# Patient Record
Sex: Female | Born: 1990 | Race: White | Hispanic: No | Smoking: Current every day smoker
Health system: Southern US, Community
[De-identification: ages and names within clinical notes are randomized; demographics above are authoritative.]

## PROBLEM LIST (undated history)

## (undated) ENCOUNTER — Inpatient Hospital Stay (HOSPITAL_COMMUNITY): Payer: Self-pay

## (undated) DIAGNOSIS — R42 Dizziness and giddiness: Secondary | ICD-10-CM

## (undated) DIAGNOSIS — O139 Gestational [pregnancy-induced] hypertension without significant proteinuria, unspecified trimester: Secondary | ICD-10-CM

## (undated) DIAGNOSIS — N926 Irregular menstruation, unspecified: Principal | ICD-10-CM

## (undated) DIAGNOSIS — F329 Major depressive disorder, single episode, unspecified: Secondary | ICD-10-CM

## (undated) DIAGNOSIS — O24419 Gestational diabetes mellitus in pregnancy, unspecified control: Secondary | ICD-10-CM

## (undated) DIAGNOSIS — T7840XA Allergy, unspecified, initial encounter: Secondary | ICD-10-CM

## (undated) DIAGNOSIS — F988 Other specified behavioral and emotional disorders with onset usually occurring in childhood and adolescence: Secondary | ICD-10-CM

## (undated) DIAGNOSIS — D649 Anemia, unspecified: Secondary | ICD-10-CM

## (undated) DIAGNOSIS — R1031 Right lower quadrant pain: Secondary | ICD-10-CM

## (undated) DIAGNOSIS — Z3490 Encounter for supervision of normal pregnancy, unspecified, unspecified trimester: Secondary | ICD-10-CM

## (undated) DIAGNOSIS — F32A Depression, unspecified: Secondary | ICD-10-CM

## (undated) DIAGNOSIS — Z309 Encounter for contraceptive management, unspecified: Secondary | ICD-10-CM

## (undated) HISTORY — DX: Irregular menstruation, unspecified: N92.6

## (undated) HISTORY — DX: Allergy, unspecified, initial encounter: T78.40XA

## (undated) HISTORY — DX: Dizziness and giddiness: R42

## (undated) HISTORY — DX: Other specified behavioral and emotional disorders with onset usually occurring in childhood and adolescence: F98.8

## (undated) HISTORY — DX: Right lower quadrant pain: R10.31

## (undated) HISTORY — DX: Encounter for contraceptive management, unspecified: Z30.9

## (undated) HISTORY — PX: SUPERFICIAL LYMPH NODE BIOPSY / EXCISION: SUR127

## (undated) HISTORY — DX: Gestational diabetes mellitus in pregnancy, unspecified control: O24.419

## (undated) HISTORY — DX: Gestational (pregnancy-induced) hypertension without significant proteinuria, unspecified trimester: O13.9

---
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
## 2010-04-05 ENCOUNTER — Inpatient Hospital Stay (HOSPITAL_COMMUNITY): Admission: AD | Admit: 2010-04-05 | Discharge: 2010-04-05 | Payer: Self-pay | Admitting: Family Medicine

## 2010-04-05 ENCOUNTER — Ambulatory Visit: Payer: Self-pay | Admitting: Family Medicine

## 2010-05-17 ENCOUNTER — Ambulatory Visit (HOSPITAL_COMMUNITY): Admission: RE | Admit: 2010-05-17 | Discharge: 2010-05-17 | Payer: Self-pay | Admitting: Obstetrics & Gynecology

## 2010-05-26 ENCOUNTER — Emergency Department (HOSPITAL_COMMUNITY): Admission: EM | Admit: 2010-05-26 | Discharge: 2010-05-27 | Payer: Self-pay | Admitting: Emergency Medicine

## 2010-07-19 ENCOUNTER — Inpatient Hospital Stay (HOSPITAL_COMMUNITY): Admission: AD | Admit: 2010-07-19 | Discharge: 2010-07-19 | Payer: Self-pay | Admitting: Obstetrics & Gynecology

## 2010-07-19 ENCOUNTER — Ambulatory Visit: Payer: Self-pay | Admitting: Obstetrics & Gynecology

## 2010-07-29 ENCOUNTER — Ambulatory Visit: Payer: Self-pay | Admitting: Obstetrics and Gynecology

## 2010-07-29 ENCOUNTER — Inpatient Hospital Stay (HOSPITAL_COMMUNITY): Admission: AD | Admit: 2010-07-29 | Discharge: 2010-07-29 | Payer: Self-pay | Admitting: Obstetrics and Gynecology

## 2010-08-29 ENCOUNTER — Inpatient Hospital Stay (HOSPITAL_COMMUNITY): Admission: AD | Admit: 2010-08-29 | Discharge: 2010-09-02 | Payer: Self-pay | Admitting: Obstetrics and Gynecology

## 2010-08-29 ENCOUNTER — Ambulatory Visit: Payer: Self-pay | Admitting: Obstetrics and Gynecology

## 2010-08-31 DIAGNOSIS — O1490 Unspecified pre-eclampsia, unspecified trimester: Secondary | ICD-10-CM

## 2010-10-27 ENCOUNTER — Encounter: Payer: Self-pay | Admitting: *Deleted

## 2010-12-18 LAB — PROTEIN / CREATININE RATIO, URINE
Protein Creatinine Ratio: 0.41 — ABNORMAL HIGH (ref 0.00–0.15)
Total Protein, Urine: 12 mg/dL

## 2010-12-18 LAB — COMPREHENSIVE METABOLIC PANEL
ALT: 10 U/L (ref 0–35)
Albumin: 2.8 g/dL — ABNORMAL LOW (ref 3.5–5.2)
Alkaline Phosphatase: 137 U/L — ABNORMAL HIGH (ref 39–117)
BUN: 6 mg/dL (ref 6–23)
CO2: 19 mEq/L (ref 19–32)
Chloride: 108 mEq/L (ref 96–112)
Creatinine, Ser: 0.66 mg/dL (ref 0.4–1.2)
GFR calc non Af Amer: >60 mL/min (ref >60)
Glucose, Bld: 82 mg/dL (ref 70–99)

## 2010-12-18 LAB — CREATININE, SERUM
Creatinine, Ser: 1 mg/dL (ref 0.4–1.2)
GFR calc Af Amer: >60 mL/min (ref >60)
GFR calc non Af Amer: >60 mL/min (ref >60)

## 2010-12-18 LAB — STREP B DNA PROBE

## 2010-12-18 LAB — CBC
HCT: 30.7 % — ABNORMAL LOW (ref 36.0–46.0)
HCT: 31.5 % — ABNORMAL LOW (ref 36.0–46.0)
Hemoglobin: 10.7 g/dL — ABNORMAL LOW (ref 12.0–15.0)
Hemoglobin: 5.4 g/dL — CL (ref 12.0–15.0)
Hemoglobin: 6.6 g/dL — CL (ref 12.0–15.0)
MCH: 29.1 pg (ref 26.0–34.0)
MCH: 29.3 pg (ref 26.0–34.0)
MCV: 86.9 fL (ref 78.0–100.0)
Platelets: 106 10*3/uL — ABNORMAL LOW (ref 150–400)
Platelets: 111 10*3/uL — ABNORMAL LOW (ref 150–400)
Platelets: 117 10*3/uL — ABNORMAL LOW (ref 150–400)
RBC: 1.84 MIL/uL — ABNORMAL LOW (ref 3.87–5.11)
RBC: 2.27 MIL/uL — ABNORMAL LOW (ref 3.87–5.11)
RBC: 3.55 MIL/uL — ABNORMAL LOW (ref 3.87–5.11)
RBC: 3.64 MIL/uL — ABNORMAL LOW (ref 3.87–5.11)
RDW: 12.6 % (ref 11.5–15.5)
WBC: 6.9 10*3/uL (ref 4.0–10.5)
WBC: 8.9 10*3/uL (ref 4.0–10.5)

## 2010-12-18 LAB — URIC ACID: Uric Acid, Serum: 3.6 mg/dL (ref 2.4–7.0)

## 2010-12-18 LAB — RPR: RPR Ser Ql: NONREACTIVE

## 2010-12-18 LAB — DIFFERENTIAL
Basophils Absolute: 0 10*3/uL (ref 0.0–0.1)
Eosinophils Relative: 1 % (ref 0–5)
Lymphs Abs: 1.5 10*3/uL (ref 0.7–4.0)
Monocytes Absolute: 0.6 10*3/uL (ref 0.1–1.0)

## 2010-12-18 LAB — MRSA PCR SCREENING: MRSA by PCR: NEGATIVE

## 2010-12-18 LAB — ABO/RH: ABO/RH(D): O POS

## 2010-12-19 LAB — URINALYSIS, ROUTINE W REFLEX MICROSCOPIC
Bilirubin Urine: NEGATIVE
Glucose, UA: 100 mg/dL — AB
Ketones, ur: NEGATIVE mg/dL
Specific Gravity, Urine: <1.005 — ABNORMAL LOW (ref 1.005–1.030)
Urobilinogen, UA: 0.2 mg/dL (ref 0.0–1.0)

## 2010-12-19 LAB — URINE MICROSCOPIC-ADD ON

## 2010-12-20 LAB — URINALYSIS, ROUTINE W REFLEX MICROSCOPIC
Ketones, ur: NEGATIVE mg/dL
Specific Gravity, Urine: 1.01 (ref 1.005–1.030)
pH: 6 (ref 5.0–8.0)

## 2010-12-20 LAB — GC/CHLAMYDIA PROBE AMP, GENITAL: GC Probe Amp, Genital: NEGATIVE

## 2010-12-20 LAB — URINE MICROSCOPIC-ADD ON

## 2010-12-20 LAB — FETAL FIBRONECTIN: Fetal Fibronectin: NEGATIVE

## 2010-12-23 LAB — URINALYSIS, ROUTINE W REFLEX MICROSCOPIC
Bilirubin Urine: NEGATIVE
Hgb urine dipstick: NEGATIVE
Ketones, ur: 40 mg/dL — AB
Nitrite: NEGATIVE
pH: 7 (ref 5.0–8.0)

## 2011-01-22 LAB — RAPID STREP SCREEN (MED CTR MEBANE ONLY): Streptococcus, Group A Screen (Direct): NEGATIVE

## 2011-02-22 NOTE — Op Note (Signed)
   NAME:  Kathleen Patrick, Kathleen Patrick                      ACCOUNT NO.:  [id number]   MEDICAL RECORD NO.:  [id number]                   PATIENT TYPE:  AMB   LOCATION:  DSC                                  FACILITY:  [facility]   PHYSICIAN:  Prabhakar D. Pendse, M.D.           DATE OF BIRTH:  08-May-1991   DATE OF PROCEDURE:  03/22/2003  DATE OF DISCHARGE:                                 OPERATIVE REPORT   PREOPERATIVE DIAGNOSIS:  Left posterior neck mass, possible lymph node.   POSTOPERATIVE DIAGNOSIS:  Left posterior neck mass.   PROCEDURE:  Excision of left posterior neck mass and layered repair.   SURGEON:  Prabhakar D. Levie Heritage, M.D.   ASSISTANT:  Nurse.   ANESTHESIA:  Nurse.   DESCRIPTION OF PROCEDURE:  Under satisfactory general anesthesia, patient in  the supine position, with her face turned towards the right, the left neck  region was thoroughly prepped and draped in the usual manner.  About a 3 cm  long transverse incision was made directly over the palpable mass.  The skin  and subcutaneous tissue was incised.  Bleeders were individually clamped,  cut and electrocoagulated.  The posterior aspect of the sternocleidomastoid  muscle was overlying the mass which was incised, and the mass was exposed.  It appeared to be either an enlarged lymph node or incorporated fatty  tissue.  The entire mass was removed in total.  Hemostasis was accomplished.  The wound was irrigated.  The muscles were approximated with 4-0 Vicryl  interrupted sutures, subcutaneous tissue with 4-0 Vicryl.  The skin was  closed with 4-0 Monocryl subcuticular sutures.  Steri-Strips applied.  Appropriate dressing applied.  Throughout the procedure, the patient's vital  signs remained stable.  The patient withstood the procedure well and was  transferred to the recovery room in satisfactory general condition.                                               Prabhakar D. Levie Heritage, M.D.    PDP/MEDQ  D:  03/22/2003  T:   03/22/2003  Job:  [id number]   cc:   Colon Flattery, MD  [address]  [place]  [place] [address]  Fax: [phone]

## 2011-08-19 ENCOUNTER — Emergency Department (HOSPITAL_COMMUNITY)
Admission: EM | Admit: 2011-08-19 | Discharge: 2011-08-20 | Disposition: A | Discharge status: Home / Self Care | Payer: Medicaid Other | Attending: Emergency Medicine | Admitting: Emergency Medicine

## 2011-08-19 DIAGNOSIS — N938 Other specified abnormal uterine and vaginal bleeding: Secondary | ICD-10-CM | POA: Insufficient documentation

## 2011-08-19 DIAGNOSIS — N949 Unspecified condition associated with female genital organs and menstrual cycle: Secondary | ICD-10-CM | POA: Insufficient documentation

## 2011-08-19 DIAGNOSIS — F17200 Nicotine dependence, unspecified, uncomplicated: Secondary | ICD-10-CM | POA: Insufficient documentation

## 2011-08-19 LAB — URINALYSIS, ROUTINE W REFLEX MICROSCOPIC
Glucose, UA: NEGATIVE mg/dL
Ketones, ur: NEGATIVE mg/dL
Leukocytes, UA: NEGATIVE
Specific Gravity, Urine: >1.03 — ABNORMAL HIGH (ref 1.005–1.030)
pH: 6 (ref 5.0–8.0)

## 2011-08-19 LAB — URINE MICROSCOPIC-ADD ON

## 2011-08-19 NOTE — ED Notes (Signed)
Vaginal bleeding that started yesterday with pain. Pt states  ' I went through 10 tampons in 2 hours" pt states they were super tampons. Pt not sure if this is her cycle or if she could be pregnant.

## 2011-08-19 NOTE — ED Notes (Signed)
Vaginal bleeding, for 2 days with abd pain.   Alert, color good, Says periods are irregular, and taking bcp's.

## 2011-08-20 LAB — OB RESULTS CONSOLE GC/CHLAMYDIA
Chlamydia: NEGATIVE
Gonorrhea: NEGATIVE

## 2011-08-20 LAB — CBC
HCT: 37.3 % (ref 36.0–46.0)
Hemoglobin: 11.7 g/dL — ABNORMAL LOW (ref 12.0–15.0)
MCHC: 31.4 g/dL (ref 30.0–36.0)
MCV: 82.9 fL (ref 78.0–100.0)
RDW: 16.1 % — ABNORMAL HIGH (ref 11.5–15.5)

## 2011-08-20 MED ORDER — KETOROLAC TROMETHAMINE 60 MG/2ML IM SOLN
60.0000 mg | Freq: Once | INTRAMUSCULAR | Status: AC
  Administered 2011-08-20: 60 mg via INTRAMUSCULAR
  Filled 2011-08-20: qty 2

## 2011-08-20 NOTE — ED Provider Notes (Signed)
Medical screening examination/treatment/procedure(s) were conducted as a shared visit with non-physician practitioner(s) and myself.  I personally evaluated the patient during the encounter.  Complains of excessive vaginal bleeding. Menstrual cycle is irregular.  No complaints of weakness or dizziness. Hemoglobin stable.  Followup with OB/GYN  Donnetta Hutching, MD 08/20/11 [phone]

## 2011-08-20 NOTE — ED Provider Notes (Signed)
History     CSN: [id number] Arrival date & time: 08/19/2011 10:00 PM   First MD Initiated Contact with Patient 08/19/11 2239      Chief Complaint  Patient presents with  . Vaginal Pain    (Consider location/radiation/quality/duration/timing/severity/associated sxs/prior treatment) Patient is a 20 y.o. female presenting with vaginal pain. The history is provided by the patient.  Vaginal Pain This is a new problem. The current episode started yesterday. The problem occurs constantly. The problem has been unchanged. Pertinent negatives include no abdominal pain, arthralgias, chest pain, congestion, fatigue, fever, headaches, joint swelling, nausea, neck pain, numbness, rash, sore throat, urinary symptoms, vomiting or weakness. Associated symptoms comments: Vaginal bleeding . The symptoms are aggravated by nothing. She has tried nothing for the symptoms.    History reviewed. No pertinent past medical history.  Past Surgical History  Procedure Date  . Superficial lymph node biopsy / excision     No family history on file.  History  Substance Use Topics  . Smoking status: Current Everyday Smoker  . Smokeless tobacco: Not on file  . Alcohol Use: No    OB History    Grav Para Term Preterm Abortions TAB SAB Ect Mult Living                  Review of Systems  Constitutional: Negative for fever and fatigue.  HENT: Negative for congestion, sore throat and neck pain.   Eyes: Negative.   Respiratory: Negative for chest tightness and shortness of breath.   Cardiovascular: Negative for chest pain.  Gastrointestinal: Negative for nausea, vomiting and abdominal pain.  Genitourinary: Positive for vaginal bleeding, vaginal pain and pelvic pain. Negative for vaginal discharge.       Pelvic cramping   Musculoskeletal: Negative for joint swelling and arthralgias.  Skin: Negative.  Negative for rash and wound.  Neurological: Negative for dizziness, weakness, light-headedness, numbness  and headaches.  Hematological: Negative.   Psychiatric/Behavioral: Negative.     Allergies  Cefzil  Home Medications   Current Outpatient Rx  Name Route Sig Dispense Refill  . AMPHETAMINE-DEXTROAMPHETAMINE 20 MG PO CP24 Oral Take 20 mg by mouth every morning.      Marland Kitchen ESCITALOPRAM OXALATE 10 MG PO TABS Oral Take 10 mg by mouth daily.      . IBUPROFEN 200 MG PO TABS Oral Take 400 mg by mouth as needed. For pain     . NORGESTIMATE-ETH ESTRADIOL 0.25-35 MG-MCG PO TABS Oral Take 1 tablet by mouth daily.        BP 117/69  Pulse 74  Temp(Src) 97.6 F (36.4 C) (Oral)  Resp 20  Ht 5\' 4"  (1.626 m)  Wt 180 lb (81.647 kg)  BMI 30.90 kg/m2  SpO2 97%  LMP 08/18/2011  Physical Exam  Nursing note and vitals reviewed. Constitutional: She is oriented to person, place, and time. She appears well-developed and well-nourished.  HENT:  Head: Normocephalic and atraumatic.  Eyes: Conjunctivae are normal.  Neck: Normal range of motion.  Cardiovascular: Normal rate, regular rhythm, normal heart sounds and intact distal pulses.   Pulmonary/Chest: Effort normal and breath sounds normal. She has no wheezes.  Abdominal: Soft. Bowel sounds are normal. There is no tenderness. There is no rebound.  Genitourinary: Uterus is tender. Uterus is not enlarged. Cervix exhibits no motion tenderness, no discharge and no friability. Right adnexum displays no mass and no tenderness. Left adnexum displays no mass and no tenderness. No vaginal discharge found.  Os closed.  Slight ttp over uterus,  No cmt.  Musculoskeletal: Normal range of motion.  Neurological: She is alert and oriented to person, place, and time.  Skin: Skin is warm and dry.  Psychiatric: She has a normal mood and affect.    ED Course  Procedures (including critical care time)  Labs Reviewed  URINALYSIS, ROUTINE W REFLEX MICROSCOPIC - Abnormal; Notable for the following:    Specific Gravity, Urine >1.030 (*)    Hgb urine dipstick  MODERATE (*)    All other components within normal limits  CBC - Abnormal; Notable for the following:    Hemoglobin 11.7 (*)    RDW 16.1 (*)    All other components within normal limits  POCT PREGNANCY, URINE  URINE MICROSCOPIC-ADD ON  POCT PREGNANCY, URINE  GC/CHLAMYDIA PROBE AMP, GENITAL  RPR  WET PREP, GENITAL   No results found.   No diagnosis found.    MDM  Dysfunctional uterine bleeding.  Labs stable.  Patient to f/u with Dr Emelda Fear - encouraged to call in the morning.        Candis Musa, PA 08/20/11 [phone]

## 2011-10-08 NOTE — L&D Delivery Note (Signed)
Delivery Note Pt up to br to void, had gush of blood per pt- called out for help, rn assisted back to bed, water broke and infant was delivered by RN as I entered the room. At 9:54 AM a viable female was delivered via Vaginal, Spontaneous Delivery (Presentation:LOA).  APGAR: 9, 9; weight 6 lb 11.2 oz (3040 g).   Placenta status: Intact, Spontaneous.  Cord: 3 vessels with the following complications: None.    Anesthesia: None  Episiotomy: None Lacerations: 1st degree;Perineal Suture Repair: 3.0 vicryl rapide Est. Blood Loss (mL): 400  Mom to postpartum.  Baby to nursery-stable.  Plans to bottlefeed, desires Mirena.  No inpatient circumcision.  Marge Duncans 07/11/2012, 10:31 AM

## 2011-11-05 LAB — OB RESULTS CONSOLE ABO/RH: RH Type: POSITIVE

## 2011-11-05 LAB — OB RESULTS CONSOLE RPR: RPR: NONREACTIVE

## 2011-11-05 LAB — OB RESULTS CONSOLE HEPATITIS B SURFACE ANTIGEN: Hepatitis B Surface Ag: NEGATIVE

## 2011-12-22 ENCOUNTER — Encounter (HOSPITAL_COMMUNITY): Payer: Self-pay | Admitting: Emergency Medicine

## 2011-12-22 ENCOUNTER — Emergency Department (HOSPITAL_COMMUNITY)
Admission: EM | Admit: 2011-12-22 | Discharge: 2011-12-22 | Disposition: A | Discharge status: Home / Self Care | Payer: Medicaid Other | Attending: Emergency Medicine | Admitting: Emergency Medicine

## 2011-12-22 DIAGNOSIS — Z331 Pregnant state, incidental: Secondary | ICD-10-CM

## 2011-12-22 DIAGNOSIS — R5381 Other malaise: Secondary | ICD-10-CM | POA: Insufficient documentation

## 2011-12-22 DIAGNOSIS — O99891 Other specified diseases and conditions complicating pregnancy: Secondary | ICD-10-CM | POA: Insufficient documentation

## 2011-12-22 DIAGNOSIS — R197 Diarrhea, unspecified: Secondary | ICD-10-CM | POA: Insufficient documentation

## 2011-12-22 DIAGNOSIS — R112 Nausea with vomiting, unspecified: Secondary | ICD-10-CM | POA: Insufficient documentation

## 2011-12-22 DIAGNOSIS — R109 Unspecified abdominal pain: Secondary | ICD-10-CM | POA: Insufficient documentation

## 2011-12-22 DIAGNOSIS — A084 Viral intestinal infection, unspecified: Secondary | ICD-10-CM

## 2011-12-22 DIAGNOSIS — R10816 Epigastric abdominal tenderness: Secondary | ICD-10-CM | POA: Insufficient documentation

## 2011-12-22 DIAGNOSIS — A088 Other specified intestinal infections: Secondary | ICD-10-CM | POA: Insufficient documentation

## 2011-12-22 LAB — URINALYSIS, ROUTINE W REFLEX MICROSCOPIC
Glucose, UA: NEGATIVE mg/dL
Hgb urine dipstick: NEGATIVE
Leukocytes, UA: NEGATIVE
Specific Gravity, Urine: >1.03 — ABNORMAL HIGH (ref 1.005–1.030)
Urobilinogen, UA: 0.2 mg/dL (ref 0.0–1.0)

## 2011-12-22 MED ORDER — SODIUM CHLORIDE 0.9 % IV BOLUS (SEPSIS)
1000.0000 mL | Freq: Once | INTRAVENOUS | Status: AC
  Administered 2011-12-22: 1000 mL via INTRAVENOUS

## 2011-12-22 MED ORDER — ONDANSETRON HCL 4 MG PO TABS: 4.0000 mg | ORAL_TABLET | Freq: Four times a day (QID) | ORAL | Status: AC

## 2011-12-22 MED ORDER — PRENATAL COMPLETE 14-0.4 MG PO TABS: 1.0000 | ORAL_TABLET | Freq: Every day | ORAL | Status: DC

## 2011-12-22 MED ORDER — ONDANSETRON HCL 4 MG/2ML IJ SOLN
8.0000 mg | Freq: Once | INTRAMUSCULAR | Status: AC
  Administered 2011-12-22: 8 mg via INTRAVENOUS
  Filled 2011-12-22: qty 4

## 2011-12-22 MED ORDER — MORPHINE SULFATE 2 MG/ML IJ SOLN
2.0000 mg | Freq: Once | INTRAMUSCULAR | Status: AC
  Administered 2011-12-22: 2 mg via INTRAVENOUS
  Filled 2011-12-22: qty 1

## 2011-12-22 NOTE — ED Notes (Addendum)
Pt c/o n/v/d since 0200. Pt is [redacted] weeks pregnant

## 2011-12-22 NOTE — Discharge Instructions (Signed)
Viral Gastroenteritis Viral gastroenteritis is also known as stomach flu. This condition affects the stomach and intestinal tract. It can cause sudden diarrhea and vomiting. The illness typically lasts 3 to 8 days. Most people develop an immune response that eventually gets rid of the virus. While this natural response develops, the virus can make you quite ill. CAUSES  Many different viruses can cause gastroenteritis, such as rotavirus or noroviruses. You can catch one of these viruses by consuming contaminated food or water. You may also catch a virus by sharing utensils or other personal items with an infected person or by touching a contaminated surface. SYMPTOMS  The most common symptoms are diarrhea and vomiting. These problems can cause a severe loss of body fluids (dehydration) and a body salt (electrolyte) imbalance. Other symptoms may include:  Fever.   Headache.   Fatigue.   Abdominal pain.  DIAGNOSIS  Your caregiver can usually diagnose viral gastroenteritis based on your symptoms and a physical exam. A stool sample may also be taken to test for the presence of viruses or other infections. TREATMENT  This illness typically goes away on its own. Treatments are aimed at rehydration. The most serious cases of viral gastroenteritis involve vomiting so severely that you are not able to keep fluids down. In these cases, fluids must be given through an intravenous line (IV). HOME CARE INSTRUCTIONS   Drink enough fluids to keep your urine clear or pale yellow. Drink small amounts of fluids frequently and increase the amounts as tolerated.   Ask your caregiver for specific rehydration instructions.   Avoid:   Foods high in sugar.   Alcohol.   Carbonated drinks.   Tobacco.   Juice.   Caffeine drinks.   Extremely hot or cold fluids.   Fatty, greasy foods.   Too much intake of anything at one time.   Dairy products until 24 to 48 hours after diarrhea stops.   You may  consume probiotics. Probiotics are active cultures of beneficial bacteria. They may lessen the amount and number of diarrheal stools in adults. Probiotics can be found in yogurt with active cultures and in supplements.   Wash your hands well to avoid spreading the virus.   Only take over-the-counter or prescription medicines for pain, discomfort, or fever as directed by your caregiver. Do not give aspirin to children. Antidiarrheal medicines are not recommended.   Ask your caregiver if you should continue to take your regular prescribed and over-the-counter medicines.   Keep all follow-up appointments as directed by your caregiver.  SEEK IMMEDIATE MEDICAL CARE IF:   You are unable to keep fluids down.   You do not urinate at least once every 6 to 8 hours.   You develop shortness of breath.   You notice blood in your stool or vomit. This may look like coffee grounds.   You have abdominal pain that increases or is concentrated in one small area (localized).   You have persistent vomiting or diarrhea.   You have a fever.   The patient is a child younger than 3 months, and he or she has a fever.   The patient is a child older than 3 months, and he or she has a fever and persistent symptoms.   The patient is a child older than 3 months, and he or she has a fever and symptoms suddenly get worse.   The patient is a baby, and he or she has no tears when crying.  MAKE SURE YOU:     Understand these instructions.   Will watch your condition.   Will get help right away if you are not doing well or get worse.  Document Released: 09/23/2005 Document Revised: 09/12/2011 Document Reviewed: 07/10/2011 [employer] Patient Information 2012 [employer]. 

## 2011-12-22 NOTE — ED Provider Notes (Signed)
History     CSN: [id number]  Arrival date & time 12/22/11  1243   First MD Initiated Contact with Patient 12/22/11 1307      Chief Complaint  Patient presents with  . Emesis    (Consider location/radiation/quality/duration/timing/severity/associated sxs/prior treatment) HPI Comments: G2P1 at [redacted] weeks gestation preents with N/V/D since 2am. Diffuse abdominal pain with presence in the epigastrium and lower abdomen. She has no vaginal bleeding or discharge.  Patient is a 21 y.o. female presenting with vomiting. The history is provided by the patient. No language interpreter was used.  Emesis  This is a new problem. The current episode started 12 to 24 hours ago. The problem occurs 5 to 10 times per day. The problem has been gradually worsening. The emesis has an appearance of stomach contents. There has been no fever. Associated symptoms include abdominal pain and diarrhea. Pertinent negatives include no arthralgias, no chills, no cough, no fever, no headaches and no myalgias.    History reviewed. No pertinent past medical history.  Past Surgical History  Procedure Date  . Superficial lymph node biopsy / excision     No family history on file.  History  Substance Use Topics  . Smoking status: Former Games developer  . Smokeless tobacco: Not on file  . Alcohol Use: No    OB History    Grav Para Term Preterm Abortions TAB SAB Ect Mult Living   1               Review of Systems  Constitutional: Positive for fatigue. Negative for fever, chills, activity change and appetite change.  HENT: Negative for congestion, sore throat, rhinorrhea, neck pain and neck stiffness.   Respiratory: Negative for cough and shortness of breath.   Cardiovascular: Negative for chest pain and palpitations.  Gastrointestinal: Positive for nausea, vomiting, abdominal pain and diarrhea. Negative for constipation and blood in stool.  Genitourinary: Negative for dysuria, urgency, frequency, flank pain, vaginal  bleeding, vaginal discharge and pelvic pain.  Musculoskeletal: Negative for myalgias, back pain and arthralgias.  Neurological: Negative for dizziness, weakness, light-headedness, numbness and headaches.  All other systems reviewed and are negative.    Allergies  Cefzil  Home Medications   Current Outpatient Rx  Name Route Sig Dispense Refill  . ONDANSETRON HCL 4 MG PO TABS Oral Take 1 tablet (4 mg total) by mouth every 6 (six) hours. 12 tablet 0  . PRENATAL COMPLETE 14-0.4 MG PO TABS Oral Take 1 tablet by mouth daily. 60 each 0    BP 101/57  Pulse 73  Temp(Src) 98.3 F (36.8 C) (Oral)  Resp 16  Ht 5\' 4"  (1.626 m)  Wt 170 lb (77.111 kg)  BMI 29.18 kg/m2  SpO2 100%  LMP 08/18/2011  Physical Exam  Nursing note and vitals reviewed. Constitutional: She is oriented to person, place, and time. She appears well-developed and well-nourished. No distress.  HENT:  Head: Normocephalic and atraumatic.  Mouth/Throat: Oropharynx is clear and moist.  Eyes: Conjunctivae and EOM are normal. Pupils are equal, round, and reactive to light.  Neck: Normal range of motion. Neck supple.  Cardiovascular: Normal rate, regular rhythm, normal heart sounds and intact distal pulses.  Exam reveals no gallop and no friction rub.   No murmur heard. Pulmonary/Chest: Effort normal and breath sounds normal. No respiratory distress.  Abdominal: Soft. Bowel sounds are normal. There is tenderness (epigastric and lower abdominal tenderness). There is no rebound and no guarding.       Gravid uterus  with fundus palpable below umbilicus  Musculoskeletal: Normal range of motion. She exhibits no edema and no tenderness.  Neurological: She is alert and oriented to person, place, and time. No cranial nerve deficit.  Skin: Skin is warm and dry.    ED Course  [place] bedside Date/Time: 12/22/2011 1:40 PM Performed by: Dayton Bailiff Authorized by: Dayton Bailiff Consent: Verbal consent obtained. Written consent not  obtained. Risks and benefits: risks, benefits and alternatives were discussed Consent given by: patient Patient understanding: patient states understanding of the procedure being performed Patient identity confirmed: verbally with patient Time out: Immediately prior to procedure a "time out" was called to verify the correct patient, procedure, equipment, support staff and site/side marked as required. Patient tolerance: Patient tolerated the procedure well with no immediate complications. Comments: Good fetal movement and fetal heart activity visualized and captured in a clip.  Single IUP. Performed using transabdominal approach using phased array probe.  Performed due to mild abdominal pain to confirm IUP and FHT   (including critical care time)  Labs Reviewed  URINALYSIS, ROUTINE W REFLEX MICROSCOPIC - Abnormal; Notable for the following:    Specific Gravity, Urine >1.030 (*)    Ketones, ur >80 (*)    All other components within normal limits   No results found.   1. Viral gastroenteritis   2. Pregnancy, incidental       MDM  Viral gastroenteritis and pregnancy. Patient was found to be dehydrated secondary ketones in the urine. She received 2 L of IV fluids with improvement of her symptoms. She is able tolerate oral intake in the emergency department. Will be discharged home with a prescription for Zofran. Instructed to followup with her primary care physician. I also provided a prescription for prenatal vitamins. Her to followup with her OB/GYN this week. I have no concern about abnormality secondary to her pregnancy as she had a normal ultrasound. Per should continue aggressive oral hydration at home. Provided strict return precautions        Dayton Bailiff, MD 12/22/11 [phone]

## 2012-04-05 ENCOUNTER — Emergency Department (HOSPITAL_COMMUNITY)
Admission: EM | Admit: 2012-04-05 | Discharge: 2012-04-05 | Disposition: A | Discharge status: Home / Self Care | Payer: Medicaid Other | Attending: Emergency Medicine | Admitting: Emergency Medicine

## 2012-04-05 ENCOUNTER — Encounter (HOSPITAL_COMMUNITY): Payer: Self-pay | Admitting: *Deleted

## 2012-04-05 DIAGNOSIS — Z3490 Encounter for supervision of normal pregnancy, unspecified, unspecified trimester: Secondary | ICD-10-CM

## 2012-04-05 DIAGNOSIS — M549 Dorsalgia, unspecified: Secondary | ICD-10-CM | POA: Insufficient documentation

## 2012-04-05 DIAGNOSIS — N390 Urinary tract infection, site not specified: Secondary | ICD-10-CM | POA: Insufficient documentation

## 2012-04-05 DIAGNOSIS — O2390 Unspecified genitourinary tract infection in pregnancy, unspecified trimester: Secondary | ICD-10-CM | POA: Insufficient documentation

## 2012-04-05 HISTORY — DX: Encounter for supervision of normal pregnancy, unspecified, unspecified trimester: Z34.90

## 2012-04-05 LAB — URINALYSIS, ROUTINE W REFLEX MICROSCOPIC
Glucose, UA: NEGATIVE mg/dL
Ketones, ur: NEGATIVE mg/dL
Nitrite: NEGATIVE
Protein, ur: NEGATIVE mg/dL
pH: 7 (ref 5.0–8.0)

## 2012-04-05 LAB — URINE MICROSCOPIC-ADD ON

## 2012-04-05 MED ORDER — NITROFURANTOIN MACROCRYSTAL 100 MG PO CAPS
100.0000 mg | ORAL_CAPSULE | Freq: Once | ORAL | Status: AC
  Administered 2012-04-05: 100 mg via ORAL
  Filled 2012-04-05: qty 1

## 2012-04-05 MED ORDER — NITROFURANTOIN MACROCRYSTAL 100 MG PO CAPS: 100.0000 mg | ORAL_CAPSULE | Freq: Two times a day (BID) | ORAL | Status: AC

## 2012-04-05 MED ORDER — ABCIXIMAB 2 MG/ML IV SOLN
INTRAVENOUS | Status: DC
Start: 2012-04-05 — End: 2012-04-05
  Filled 2012-04-05: qty 5

## 2012-04-05 MED ORDER — SODIUM CHLORIDE 0.9 % IV BOLUS (SEPSIS): 1000.0000 mL | INTRAVENOUS | Status: DC

## 2012-04-05 NOTE — Discharge Instructions (Signed)
Drink lots of fluids. Take the antibiotic as directed. Followup with your OB GYN this coming week.

## 2012-04-05 NOTE — Progress Notes (Signed)
Vinson Moselle, RN @ [facility] to notify that no FHR had been tracing.  She stated that she had been adjusting monitor and reports increased FM.  Pt to go to bathroom and then attempt to adjust again

## 2012-04-05 NOTE — Progress Notes (Signed)
Pt in to [facility] @ 27.2 wks c/o contractions.  Pt is G2/P1.  No reports of bleeding or leaking of fluid.  Pt reports good fetal movement

## 2012-04-05 NOTE — ED Provider Notes (Signed)
History     CSN: [id number]  Arrival date & time 04/05/12  0009   First MD Initiated Contact with Patient 04/05/12 0029      Chief Complaint  Patient presents with  . Contractions  . Back Pain  . Abdominal Pain    (Consider location/radiation/quality/duration/timing/severity/associated sxs/prior treatment) HPI Kathleen Patrick is a 21 y.o. female G2 P1 [facility] 07/03/2012  who presents to the Emergency Department complaining of lower back pain that radiates around to her abdomen that began an hour ago. Denies fever, chills, nausea, vomiting, diarrhea,dysuria. Increased fetal activity. NO vaginal discharge, leaking, bleeding.  OB/GYN Dr. Emelda Fear  Past Medical History  Diagnosis Date  . Pregnancy     Past Surgical History  Procedure Date  . Superficial lymph node biopsy / excision     History reviewed. No pertinent family history.  History  Substance Use Topics  . Smoking status: Former Games developer  . Smokeless tobacco: Not on file  . Alcohol Use: No    OB History    Grav Para Term Preterm Abortions TAB SAB Ect Mult Living   1               Review of Systems  Constitutional: Negative for fever.       10 Systems reviewed and are negative for acute change except as noted in the HPI.  HENT: Negative for congestion.   Eyes: Negative for discharge and redness.  Respiratory: Negative for cough and shortness of breath.   Cardiovascular: Negative for chest pain.  Gastrointestinal: Positive for abdominal pain. Negative for vomiting.  Musculoskeletal: Positive for back pain.  Skin: Negative for rash.  Neurological: Negative for syncope, numbness and headaches.  Psychiatric/Behavioral:       No behavior change.    Allergies  Cefzil  Home Medications   Current Outpatient Rx  Name Route Sig Dispense Refill  . NITROFURANTOIN MACROCRYSTAL 100 MG PO CAPS Oral Take 1 capsule (100 mg total) by mouth 2 (two) times daily with a meal. 10 capsule 0  . PRENATAL COMPLETE 14-0.4 MG  PO TABS Oral Take 1 tablet by mouth daily. 60 each 0    BP 110/52  Pulse 75  Resp 16  LMP 08/18/2011  Physical Exam  Nursing note and vitals reviewed. Constitutional: She is oriented to person, place, and time. She appears well-developed and well-nourished. No distress.       Awake, alert, nontoxic appearance.  HENT:  Head: Atraumatic.  Eyes: Right eye exhibits no discharge. Left eye exhibits no discharge.  Neck: Neck supple.  Cardiovascular: Normal rate and normal heart sounds.   Pulmonary/Chest: Effort normal. She exhibits no tenderness.  Abdominal: Soft. There is no tenderness. There is no rebound.  Genitourinary:       Gravid uterus. No cva tenderness  Musculoskeletal: She exhibits no tenderness.       Baseline ROM, no obvious new focal weakness.  Neurological: She is alert and oriented to person, place, and time.       Mental status and motor strength appears baseline for patient and situation.  Skin: Skin is warm and dry. No rash noted.  Psychiatric: She has a normal mood and affect.    ED Course  Procedures (including critical care time)  Labs Reviewed  URINALYSIS, ROUTINE W REFLEX MICROSCOPIC - Abnormal; Notable for the following:    Leukocytes, UA MODERATE (*)     All other components within normal limits  URINE MICROSCOPIC-ADD ON - Abnormal; Notable for the following:  Squamous Epithelial / LPF MANY (*)     Bacteria, UA FEW (*)     All other components within normal limits  LAB REPORT - SCANNED   No results found.   1. Pregnancy   2. Urinary tract infection    Patient placed on toco monitor. One contraction witnessed in stay here.    MDM  Pregnant patient here with back pain that radiated to lower abdomen. Given IVF. UA positive for infection. Initiated antibiotic therapy.Patient took PO fluids. One contraction on toco otherwise mild iritability. FHT 135-149.Pt stable in ED with no significant deterioration in condition.The patient appears reasonably  screened and/or stabilized for discharge and I doubt any other medical condition or other [facility] requiring further screening, evaluation, or treatment in the ED at this time prior to discharge.  MDM Reviewed: nursing note and vitals Interpretation: labs            Nicoletta Dress. Colon Branch, MD 04/05/12 2140

## 2012-04-05 NOTE — Progress Notes (Signed)
Spoke w/ Steward Drone, RN to update on tracing.  She stated that she would update me once pt has had cervix checked

## 2012-04-05 NOTE — ED Notes (Signed)
Pt c/o lower back pain radiating around to back x 1hr. EDD: 07-03-12

## 2012-04-05 NOTE — Progress Notes (Signed)
Steward Drone, RN called to update that pt had been examined and is being treated for UTI and d/c home

## 2012-06-08 ENCOUNTER — Encounter (HOSPITAL_COMMUNITY): Payer: Self-pay | Admitting: *Deleted

## 2012-06-08 ENCOUNTER — Inpatient Hospital Stay (HOSPITAL_COMMUNITY)
Admission: AD | Admit: 2012-06-08 | Discharge: 2012-06-08 | Disposition: A | Discharge status: Home / Self Care | Payer: Medicaid Other | Source: Ambulatory Visit | Attending: Obstetrics & Gynecology | Admitting: Obstetrics & Gynecology

## 2012-06-08 DIAGNOSIS — O479 False labor, unspecified: Secondary | ICD-10-CM

## 2012-06-08 DIAGNOSIS — O4700 False labor before 37 completed weeks of gestation, unspecified trimester: Secondary | ICD-10-CM | POA: Insufficient documentation

## 2012-06-08 DIAGNOSIS — O368190 Decreased fetal movements, unspecified trimester, not applicable or unspecified: Secondary | ICD-10-CM | POA: Insufficient documentation

## 2012-06-08 HISTORY — DX: Anemia, unspecified: D64.9

## 2012-06-08 NOTE — MAU Note (Signed)
Pt states she has been having a different type of vaginal pressure than she has been experiencing since last night around 2200.  Pt was advised to come to MAU after speaking with nurse at [facility] and reported decreased kick counts of 2 in 2 hours.  No vaginal bleeding.

## 2012-06-08 NOTE — ED Provider Notes (Signed)
First Provider Initiated Contact with Patient 06/08/12 2002      Chief Complaint:  Labor Eval   Kathleen Patrick is  21 y.o. G2P1001 at [redacted]w[redacted]d presents complaining of Labor Eval .  She states none contractions are associated with none vaginal bleeding, intact membranes, along with decreased  fetal movement. Feels pressure  Obstetrical/Gynecological History: Menstrual History: OB History    Grav Para Term Preterm Abortions TAB SAB Ect Mult Living   2 1 1       1        Patient's last menstrual period was 08/18/2011.     Past Medical History: Past Medical History  Diagnosis Date  . Pregnancy   . Anemia     Past Surgical History: Past Surgical History  Procedure Date  . Superficial lymph node biopsy / excision     Family History: Family History  Problem Relation Age of Onset  . Other Neg Hx     Social History: History  Substance Use Topics  . Smoking status: Former Smoker -- 1.0 packs/day    Quit date: 04/07/2012  . Smokeless tobacco: Never Used  . Alcohol Use: No    Allergies:  Allergies  Allergen Reactions  . Cefzil (Cefprozil) Hives    Meds:  Prescriptions prior to admission  Medication Sig Dispense Refill  . Prenatal Vit-Fe Fumarate-FA (PRENATAL COMPLETE) 14-0.4 MG TABS Take 1 tablet by mouth daily.  60 each  0    Review of Systems - Please refer to the aforementioned patients' reports.     Physical Exam  Blood pressure 110/66, pulse 101, temperature 98.6 F (37 C), temperature source Oral, resp. rate 18, height 5\' 4"  (1.626 m), weight 79.379 kg (175 lb), last menstrual period 08/18/2011, SpO2 99.00%. GENERAL: Well-developed, well-nourished female in no acute distress.  LUNGS: Clear to auscultation bilaterally.  HEART: Regular rate and rhythm. ABDOMEN: Soft, nontender, nondistended, gravid.  EXTREMITIES: Nontender, no edema, 2+ distal pulses. CERVICAL EXAM: Dilatation 1.5cm   Effacement 0%   Station -3   Presentation: cephalic FHT:   Baseline rate 161  bpm   Variability moderate  Accelerations present   Decelerations none Contractions: Every 0 mins   Labs: No results found for this or any previous visit (from the past 24 hour(s)). Imaging Studies:  No results found.  Assessment: Kathleen Patrick is  20 y.o. G2P1001 at [redacted]w[redacted]d presents with reactive NST, pressure,  No labor.  Plan: Dc home. F/U Thursday as scheduled  CRESENZO-DISHMAN,Rossana Molchan 9/2/20138:13 PM

## 2012-06-29 ENCOUNTER — Encounter (HOSPITAL_COMMUNITY): Payer: Self-pay | Admitting: *Deleted

## 2012-06-29 ENCOUNTER — Inpatient Hospital Stay (HOSPITAL_COMMUNITY)
Admission: AD | Admit: 2012-06-29 | Discharge: 2012-06-29 | Disposition: A | Discharge status: Home / Self Care | Payer: Medicaid Other | Source: Ambulatory Visit | Attending: Obstetrics & Gynecology | Admitting: Obstetrics & Gynecology

## 2012-06-29 DIAGNOSIS — O99891 Other specified diseases and conditions complicating pregnancy: Secondary | ICD-10-CM | POA: Insufficient documentation

## 2012-06-29 DIAGNOSIS — Z3480 Encounter for supervision of other normal pregnancy, unspecified trimester: Secondary | ICD-10-CM

## 2012-06-29 NOTE — MAU Note (Signed)
1900 pt reports clear fluid from vagina requiring to change panty liner.  No vaginal bleeding.  Pt reports lots of vaginal pressure.  Pt states baby had been moving but decreased more today than normal.

## 2012-06-29 NOTE — MAU Provider Note (Signed)
  History     CSN: [id number]  Arrival date and time: 06/29/12 2041   First Provider Initiated Contact with Patient 06/29/12 2218         Chief Complaint  Patient presents with  . Rupture of Membranes  . Labor Eval   HPI Comments: 21yo G2P1001 @ 39.3 seen at [facility] p/w possible SROM around 1900 tonight. Pt reports some clear leakage x3 different occasions over the past few hours.  Put on a pad and noticed clear, non-urine smelling watery d/c. No vaginal bleeding. + FM. Occasional contractions, but not regular.  Frustrated that she has been a 1.5-2cm cervix for multiple weeks.  Is ready to be in labor. + pressure.   OB History    Grav Para Term Preterm Abortions TAB SAB Ect Mult Living   2 1 1       1       Past Medical History  Diagnosis Date  . Pregnancy   . Anemia     Past Surgical History  Procedure Date  . Superficial lymph node biopsy / excision     Family History  Problem Relation Age of Onset  . Other Neg Hx     History  Substance Use Topics  . Smoking status: Former Smoker -- 1.0 packs/day    Quit date: 04/07/2012  . Smokeless tobacco: Never Used  . Alcohol Use: No    Allergies:  Allergies  Allergen Reactions  . Cefzil (Cefprozil) Hives    Prescriptions prior to admission  Medication Sig Dispense Refill  . Prenatal Vit-Fe Fumarate-FA (PRENATAL COMPLETE) 14-0.4 MG TABS Take 1 tablet by mouth daily.  60 each  0  . PRESCRIPTION MEDICATION Take 1 tablet by mouth daily as needed. For migraines      . PRESCRIPTION MEDICATION Take 2 tablets by mouth once. antibiotic        Review of Systems  Constitutional: Negative for fever.  Gastrointestinal: Positive for abdominal pain.  Genitourinary: Negative for dysuria.  Neurological: Negative for headaches.   Physical Exam   Blood pressure 117/69, pulse 83, temperature 99.3 F (37.4 C), temperature source Oral, resp. rate 18, height 5\' 4"  (1.626 m), weight 82.101 kg (181 lb), last menstrual period 08/18/2011,  SpO2 100.00%.  Physical Exam  Constitutional: She appears well-developed and well-nourished. No distress.  Cardiovascular: Normal rate and regular rhythm.   No murmur heard. Respiratory: Effort normal and breath sounds normal. No respiratory distress.  GI:       gravid  Musculoskeletal: She exhibits no edema.  Neurological: She is alert.  Skin: Skin is warm and dry.   Dilation: 2 Effacement (%): 50 Cervical Position: Middle Station: -2 Presentation: Vertex Exam by:: L. Paschal, RN  FHT: 120's, + accels, no decels, mod variability Toco: irritability with occasional ctx  MAU Course  Procedures  MDM Pt w/ c/o possible SROM. No pooling on sterile spec exam. Amnisure sent. No ctx on monitor. Will d/c pt home if amnisure negative  Assessment and Plan  21 yo G2P1001 @ 39.3 p/w concern for labor/SROM, amnisure negative -D/c home with labor and bleeding precautions -F[facility] at [facility] as scheduled.  Kathleen Patrick 06/29/2012, 10:14 PM

## 2012-06-30 NOTE — MAU Provider Note (Signed)
I saw and examined patient and read NST and agree with above.  FHTs baseline 120, moderate variability, accels present, no decels - reactive NST. Napoleon Form, MD

## 2012-07-07 LAB — OB RESULTS CONSOLE GC/CHLAMYDIA: Gonorrhea: NEGATIVE

## 2012-07-10 ENCOUNTER — Encounter (HOSPITAL_COMMUNITY): Payer: Self-pay

## 2012-07-10 ENCOUNTER — Inpatient Hospital Stay (HOSPITAL_COMMUNITY)
Admission: RE | Admit: 2012-07-10 | Discharge: 2012-07-12 | DRG: 775 | Disposition: A | Discharge status: Home / Self Care | Payer: Medicaid Other | Source: Ambulatory Visit | Attending: Obstetrics & Gynecology | Admitting: Obstetrics & Gynecology

## 2012-07-10 DIAGNOSIS — O480 Post-term pregnancy: Principal | ICD-10-CM | POA: Diagnosis present

## 2012-07-10 LAB — CBC
HCT: 34.5 % — ABNORMAL LOW (ref 36.0–46.0)
Hemoglobin: 10.9 g/dL — ABNORMAL LOW (ref 12.0–15.0)
RBC: 4.26 MIL/uL (ref 3.87–5.11)
RDW: 14.1 % (ref 11.5–15.5)
WBC: 8.3 10*3/uL (ref 4.0–10.5)

## 2012-07-10 MED ORDER — LIDOCAINE HCL (PF) 1 % IJ SOLN
30.0000 mL | INTRAMUSCULAR | Status: DC | PRN
  Administered 2012-07-11: 30 mL via SUBCUTANEOUS
  Filled 2012-07-10: qty 30

## 2012-07-10 MED ORDER — IBUPROFEN 600 MG PO TABS
600.0000 mg | ORAL_TABLET | Freq: Four times a day (QID) | ORAL | Status: DC | PRN
  Administered 2012-07-11: 600 mg via ORAL
  Filled 2012-07-10: qty 1

## 2012-07-10 MED ORDER — MISOPROSTOL 25 MCG QUARTER TABLET
25.0000 ug | ORAL_TABLET | ORAL | Status: DC | PRN
  Administered 2012-07-10 (×2): 25 ug via VAGINAL
  Filled 2012-07-10 (×2): qty 0.25
  Filled 2012-07-10: qty 1

## 2012-07-10 MED ORDER — ONDANSETRON HCL 4 MG/2ML IJ SOLN: 4.0000 mg | Freq: Four times a day (QID) | INTRAMUSCULAR | Status: DC | PRN

## 2012-07-10 MED ORDER — CITRIC ACID-SODIUM CITRATE 334-500 MG/5ML PO SOLN: 30.0000 mL | ORAL | Status: DC | PRN

## 2012-07-10 MED ORDER — LACTATED RINGERS IV SOLN
INTRAVENOUS | Status: DC
  Administered 2012-07-10 – 2012-07-11 (×4): via INTRAVENOUS

## 2012-07-10 MED ORDER — TERBUTALINE SULFATE 1 MG/ML IJ SOLN: 0.2500 mg | Freq: Once | INTRAMUSCULAR | Status: AC | PRN

## 2012-07-10 MED ORDER — OXYTOCIN BOLUS FROM INFUSION
500.0000 mL | Freq: Once | INTRAVENOUS | Status: AC
  Administered 2012-07-11: 500 mL via INTRAVENOUS
  Filled 2012-07-10: qty 500

## 2012-07-10 MED ORDER — FENTANYL CITRATE 0.05 MG/ML IJ SOLN
100.0000 ug | INTRAMUSCULAR | Status: DC | PRN
  Administered 2012-07-11 (×2): 100 ug via INTRAVENOUS
  Filled 2012-07-10 (×3): qty 2

## 2012-07-10 MED ORDER — OXYCODONE-ACETAMINOPHEN 5-325 MG PO TABS: 1.0000 | ORAL_TABLET | ORAL | Status: DC | PRN

## 2012-07-10 MED ORDER — NALBUPHINE SYRINGE 5 MG/0.5 ML
5.0000 mg | INJECTION | INTRAMUSCULAR | Status: DC | PRN
  Administered 2012-07-10: 5 mg via INTRAVENOUS
  Filled 2012-07-10: qty 0.5

## 2012-07-10 MED ORDER — ZOLPIDEM TARTRATE 5 MG PO TABS
5.0000 mg | ORAL_TABLET | Freq: Every evening | ORAL | Status: DC | PRN
  Administered 2012-07-10: 5 mg via ORAL
  Filled 2012-07-10: qty 1

## 2012-07-10 MED ORDER — OXYTOCIN 40 UNITS IN LACTATED RINGERS INFUSION - SIMPLE MED: 62.5000 mL/h | Freq: Once | INTRAVENOUS | Status: DC

## 2012-07-10 MED ORDER — LACTATED RINGERS IV SOLN: 500.0000 mL | INTRAVENOUS | Status: DC | PRN

## 2012-07-10 MED ORDER — ACETAMINOPHEN 325 MG PO TABS: 650.0000 mg | ORAL_TABLET | ORAL | Status: DC | PRN

## 2012-07-10 NOTE — H&P (Signed)
Attestation of Attending Supervision of Advanced Practitioner (CNM/NP): Evaluation and management procedures were performed by the Advanced Practitioner under my supervision and collaboration.  I have reviewed the Advanced Practitioner's note and chart, and I agree with the management and plan.  HARRAWAY-SMITH, Madilynn Montante 4:50 PM     

## 2012-07-10 NOTE — Progress Notes (Signed)
Kathleen Patrick is a 21 y.o. G2P1001 at [redacted]w[redacted]d  admitted for induction of labor due to Post dates..  Subjective: Rested x 1 hr after cyotec and woke up feeling crampy.   Objective: BP 126/75  Pulse 88  Temp 98.4 F (36.9 C) (Oral)  Resp 18  Ht 5\' 4"  (1.626 m)  Wt 82.555 kg (182 lb)  BMI 31.24 kg/m2  LMP 08/18/2011      FHT:  FHR: 120 bpm, variability: moderate,  accelerations:  Present,  decelerations:  Absent UC:   Occasional mild SVE:   Dilation: 2 Effacement (%): 50 Station: Ballotable Exam by:: Jailen Coward, cnm Cx still long, posterior  Tight 2/-3 vtx -> cytotec placed  Labs: Lab Results  Component Value Date   WBC 8.3 07/10/2012   HGB 10.9* 07/10/2012   HCT 34.5* 07/10/2012   MCV 81.0 07/10/2012   PLT 101* 07/10/2012    Assessment / Plan: Multip at 41 wks undergoing cx ripening  Labor: not in labor Preeclampsia:  n/a Fetal Wellbeing:  Category I Pain Control:  n/a I/D:  n/a Anticipated MOD:  NSVD  Rashed Edler 07/10/2012, 3:54 PM

## 2012-07-10 NOTE — H&P (Signed)
Kathleen Patrick is a 21 y.o. female presenting for IOL. Maternal Medical History:  Reason for admission: 21 yo G3P1011 at 41.0 wks here for IOL indicated by late term gestation. Well-dated by 5.4 wk CRL c/w 19 wk scan (irreg cycles). Last [place] 2 wks ago all nl per pt. Uncomplicated PN course at [facility].  Contractions: Onset was 1 week ago.   Frequency: rare.   Perceived severity is mild.    Fetal activity: Perceived fetal activity is normal.   Last perceived fetal movement was within the past hour.    Prenatal complications: no prenatal complications Prenatal Complications - Diabetes: none.    OB History    Grav Para Term Preterm Abortions TAB SAB Ect Mult Living   2 1 1       1      Past Medical History  Diagnosis Date  . Pregnancy   . Anemia    Past Surgical History  Procedure Date  . Superficial lymph node biopsy / excision    Family History: family history includes Cancer in her maternal grandmother and Hypertension in her father.  There is no history of Other. Social History:  reports that she quit smoking about 3 months ago. She has never used smokeless tobacco. She reports that she does not drink alcohol or use illicit drugs.   Prenatal Transfer Tool  Maternal Diabetes: No Genetic Screening: Declined Maternal Ultrasounds/Referrals: Normal Fetal Ultrasounds or other Referrals:  None Maternal Substance Abuse:  No Significant Maternal Medications:  None Significant Maternal Lab Results:  None Other Comments GBS neg  Review of Systems  Constitutional: Negative for fever and chills.  Respiratory: Negative for cough.   Cardiovascular: Negative for leg swelling.  Gastrointestinal: Negative for vomiting.  Genitourinary: Negative for dysuria.  Skin: Negative for rash.  Neurological: Negative for headaches.    Exam by:: Trason Shifflet, cnm Blood pressure 105/75, pulse 76, temperature 98 F (36.7 C), temperature source Oral, resp. rate 20, height 5\' 4"  (1.626 m), weight 82.555 kg  (182 lb), last menstrual period 08/18/2011. Maternal Exam:  Uterine Assessment: Contraction strength is mild.  Contraction duration is 20 seconds. Contraction frequency is rare.   Abdomen: Fundal height is term size.   Estimated fetal weight is 7.5.   Fetal presentation: vertex  Introitus: Normal vulva. Normal vagina.  Ferning test: not done.  Nitrazine test: not done. Amniotic fluid character: not assessed.  Pelvis: adequate for delivery.   Cervix: Cervix evaluated by digital exam.   Posterior, soft, ext 2, int ftp to closed/long/ballotable head  Fetal Exam Fetal Monitor Review: Mode: ultrasound.   Baseline rate: 130.  Variability: moderate (6-25 bpm).   Pattern: accelerations present and no decelerations.    Fetal State Assessment: Category I - tracings are normal.     Physical Exam  Constitutional: She is oriented to person, place, and time. She appears well-developed and well-nourished. No distress.  HENT:  Head: Normocephalic.  Eyes: Pupils are equal, round, and reactive to light.  Neck: Neck supple.  Cardiovascular: Normal rate, regular rhythm and normal heart sounds.   Respiratory: Effort normal and breath sounds normal.  GI: Soft. She exhibits no distension. There is no tenderness.  Genitourinary: Vagina normal and uterus normal.  Musculoskeletal: Normal range of motion.  Neurological: She is alert and oriented to person, place, and time. She has normal reflexes.  Skin: Skin is warm and dry.  Psychiatric: She has a normal mood and affect. Her behavior is normal. Judgment and thought content normal.  Prenatal labs: ABO, Rh: O/Positive/-- (01/29 0000) Antibody: Negative (01/29 0000) Rubella: Immune (01/29 0000) RPR: Nonreactive (01/29 0000)  HBsAg: Negative (01/29 0000)  HIV: Non-reactive (01/29 0000)  GBS: Negative (09/05 0000)  2 hr OGTT 78-11-100  Assessment/Plan: 21 yo G3P1011 at 41 wk, unfavorable cx, for IOL Category 1 FHR ->Plan cytotec cervical  ripening   Kathleen Patrick 07/10/2012, 10:08 AM

## 2012-07-10 NOTE — Progress Notes (Signed)
Patient ID: Kathleen Patrick, female   DOB: May 21, 1991, 21 y.o.   MRN: [id number] S:  Pt feeling cramping and contractions but able to breathe through them.  O:   Filed Vitals:   07/10/12 1642  BP:   Pulse:   Temp: 98.5 F (36.9 C)  Resp: 18   Dilation: 2 Effacement (%): 50 Cervical Position: Middle Station: Ballotable Presentation: Vertex Exam by:: Dr. Thad Ranger  FHT:  120, moderate variability, accels present, no decels TOCO:  q 2-3 minutes  A/P Induction of labor for post-dates. Foleybulb placed without problem. Nubain or fentanyl for pain  Napoleon Form, MD

## 2012-07-11 ENCOUNTER — Encounter (HOSPITAL_COMMUNITY): Payer: Self-pay

## 2012-07-11 DIAGNOSIS — O480 Post-term pregnancy: Secondary | ICD-10-CM

## 2012-07-11 MED ORDER — SIMETHICONE 80 MG PO CHEW: 80.0000 mg | CHEWABLE_TABLET | ORAL | Status: DC | PRN

## 2012-07-11 MED ORDER — TERBUTALINE SULFATE 1 MG/ML IJ SOLN: 0.2500 mg | Freq: Once | INTRAMUSCULAR | Status: DC | PRN

## 2012-07-11 MED ORDER — NALBUPHINE SYRINGE 5 MG/0.5 ML
10.0000 mg | INJECTION | INTRAMUSCULAR | Status: DC | PRN
  Administered 2012-07-11: 10 mg via INTRAVENOUS
  Filled 2012-07-11 (×2): qty 1

## 2012-07-11 MED ORDER — OXYTOCIN 40 UNITS IN LACTATED RINGERS INFUSION - SIMPLE MED
1.0000 m[IU]/min | INTRAVENOUS | Status: DC
  Administered 2012-07-11: 2 m[IU]/min via INTRAVENOUS
  Filled 2012-07-11: qty 1000

## 2012-07-11 MED ORDER — OXYTOCIN 40 UNITS IN LACTATED RINGERS INFUSION - SIMPLE MED: 62.5000 mL/h | INTRAVENOUS | Status: DC | PRN

## 2012-07-11 MED ORDER — SODIUM CHLORIDE 0.9 % IJ SOLN: 3.0000 mL | Freq: Two times a day (BID) | INTRAMUSCULAR | Status: DC

## 2012-07-11 MED ORDER — BISACODYL 10 MG RE SUPP: 10.0000 mg | Freq: Every day | RECTAL | Status: DC | PRN

## 2012-07-11 MED ORDER — ONDANSETRON HCL 4 MG PO TABS: 4.0000 mg | ORAL_TABLET | ORAL | Status: DC | PRN

## 2012-07-11 MED ORDER — LANOLIN HYDROUS EX OINT: TOPICAL_OINTMENT | CUTANEOUS | Status: DC | PRN

## 2012-07-11 MED ORDER — SODIUM CHLORIDE 0.9 % IJ SOLN: 3.0000 mL | INTRAMUSCULAR | Status: DC | PRN

## 2012-07-11 MED ORDER — WITCH HAZEL-GLYCERIN EX PADS: 1.0000 "application " | MEDICATED_PAD | CUTANEOUS | Status: DC | PRN

## 2012-07-11 MED ORDER — SODIUM CHLORIDE 0.9 % IV SOLN: 250.0000 mL | INTRAVENOUS | Status: DC | PRN

## 2012-07-11 MED ORDER — PRENATAL MULTIVITAMIN CH
1.0000 | ORAL_TABLET | Freq: Every day | ORAL | Status: DC
  Administered 2012-07-12: 1 via ORAL
  Filled 2012-07-11: qty 1

## 2012-07-11 MED ORDER — MISOPROSTOL 200 MCG PO TABS
ORAL_TABLET | ORAL | Status: AC
  Administered 2012-07-11: 200 ug via RECTAL
  Filled 2012-07-11: qty 4

## 2012-07-11 MED ORDER — TETANUS-DIPHTH-ACELL PERTUSSIS 5-2.5-18.5 LF-MCG/0.5 IM SUSP
0.5000 mL | Freq: Once | INTRAMUSCULAR | Status: AC
  Administered 2012-07-12: 0.5 mL via INTRAMUSCULAR
  Filled 2012-07-11: qty 0.5

## 2012-07-11 MED ORDER — IBUPROFEN 600 MG PO TABS
600.0000 mg | ORAL_TABLET | Freq: Four times a day (QID) | ORAL | Status: DC
  Administered 2012-07-11 – 2012-07-12 (×4): 600 mg via ORAL
  Filled 2012-07-11 (×5): qty 1

## 2012-07-11 MED ORDER — DIBUCAINE 1 % RE OINT: 1.0000 "application " | TOPICAL_OINTMENT | RECTAL | Status: DC | PRN

## 2012-07-11 MED ORDER — BENZOCAINE-MENTHOL 20-0.5 % EX AERO
1.0000 "application " | INHALATION_SPRAY | CUTANEOUS | Status: DC | PRN
  Administered 2012-07-11: 1 via TOPICAL
  Filled 2012-07-11: qty 56

## 2012-07-11 MED ORDER — SENNOSIDES-DOCUSATE SODIUM 8.6-50 MG PO TABS
2.0000 | ORAL_TABLET | Freq: Every day | ORAL | Status: DC
  Administered 2012-07-11: 2 via ORAL

## 2012-07-11 MED ORDER — ONDANSETRON HCL 4 MG/2ML IJ SOLN: 4.0000 mg | INTRAMUSCULAR | Status: DC | PRN

## 2012-07-11 MED ORDER — FLEET ENEMA 7-19 GM/118ML RE ENEM: 1.0000 | ENEMA | Freq: Every day | RECTAL | Status: DC | PRN

## 2012-07-11 MED ORDER — OXYCODONE-ACETAMINOPHEN 5-325 MG PO TABS
1.0000 | ORAL_TABLET | ORAL | Status: DC | PRN
  Administered 2012-07-11: 1 via ORAL
  Filled 2012-07-11: qty 1

## 2012-07-11 MED ORDER — DIPHENHYDRAMINE HCL 25 MG PO CAPS: 25.0000 mg | ORAL_CAPSULE | Freq: Four times a day (QID) | ORAL | Status: DC | PRN

## 2012-07-11 MED ORDER — METHYLERGONOVINE MALEATE 0.2 MG PO TABS
0.2000 mg | ORAL_TABLET | ORAL | Status: DC | PRN
  Administered 2012-07-11: 0.2 mg via ORAL
  Filled 2012-07-11: qty 1

## 2012-07-11 MED ORDER — ZOLPIDEM TARTRATE 5 MG PO TABS: 5.0000 mg | ORAL_TABLET | Freq: Every evening | ORAL | Status: DC | PRN

## 2012-07-11 MED ORDER — MEASLES, MUMPS & RUBELLA VAC ~~LOC~~ INJ
0.5000 mL | INJECTION | Freq: Once | SUBCUTANEOUS | Status: DC
  Filled 2012-07-11: qty 0.5

## 2012-07-11 NOTE — Progress Notes (Signed)
Notified [name] CNM of massaging fundus frequently to get firm, deep and noting trickle occasionally. Fundus tender. New order received.

## 2012-07-11 NOTE — Progress Notes (Signed)
Called to evaluate boggy fundus by RN.   Pt reports need to void. Assisted to br, voided , new underwear, pad & ice pack applied.  Back to bed, fundus slightly boggy @ u-2, but firms w/ massage.  Scant lochia noted w/ massage.  Pt states this happened after birth of 1st child as well.  RN to proceed w/ previously ordered methergine po q4hr prn order.   Cheral Marker, CNM, WHNP-BC

## 2012-07-11 NOTE — Progress Notes (Signed)
Called by RN, pt had large gush of lochia w/ fundal massage, and has slow but steady trickle.  VSS. Fundus firm u/1. cytotec pr. Cheral Marker, CNM, WHNP-BC

## 2012-07-11 NOTE — Progress Notes (Signed)
Kathleen Patrick is a 21 y.o. G2P1001 at [redacted]w[redacted]d admitted for induction of labor due to Post dates. Due date 07/03/12.  Subjective: Just recently received iv pain meds, helping w/ pain slighlty.  Declines arom at this time. Family supportive at bs.  Objective: BP 120/83  Pulse 65  Temp 97.9 F (36.6 C) (Oral)  Resp 20  Ht 5\' 4"  (1.626 m)  Wt 82.555 kg (182 lb)  BMI 31.24 kg/m2  LMP 08/18/2011      FHT:  FHR: 110 bpm, variability: moderate,  accelerations:  Present,  decelerations:  Absent UC:   regular, every 2-4 minutes, not tracing well on efm d/t maternal position- toco readjusted SVE:   6/90-/2 @ 0825 by patti, rn  Labs: Lab Results  Component Value Date   WBC 8.3 07/10/2012   HGB 10.9* 07/10/2012   HCT 34.5* 07/10/2012   MCV 81.0 07/10/2012   PLT 101* 07/10/2012    Assessment / Plan: Induction of labor due to postdates,  progressing well on pitocin  Labor: progressing on pitocin, declines arom, will continue to increase pitocin as needed Preeclampsia:  n/a Fetal Wellbeing:  Category I Pain Control:  iv meds I/D:  n/a Anticipated MOD:  NSVD  Marge Duncans 07/11/2012, 10:38 AM

## 2012-07-11 NOTE — Progress Notes (Signed)
SVD of viable female.  Genella Rife CNM at bedside for delivery.

## 2012-07-12 LAB — CBC
HCT: 28.5 % — ABNORMAL LOW (ref 36.0–46.0)
MCHC: 31.6 g/dL (ref 30.0–36.0)
MCV: 81.2 fL (ref 78.0–100.0)
Platelets: 80 10*3/uL — ABNORMAL LOW (ref 150–400)
RDW: 14.4 % (ref 11.5–15.5)
WBC: 9.2 10*3/uL (ref 4.0–10.5)

## 2012-07-12 MED ORDER — IBUPROFEN 600 MG PO TABS: 600.0000 mg | ORAL_TABLET | Freq: Four times a day (QID) | ORAL | Status: DC

## 2012-07-12 NOTE — Discharge Summary (Signed)
Obstetric Discharge Summary Reason for Admission: induction of labor Prenatal Procedures: NST and ultrasound Intrapartum Procedures: spontaneous vaginal delivery Postpartum Procedures: none Complications-Operative and Postpartum: 1st degree perineal laceration Hemoglobin  Date Value Range Status  07/12/2012 9.0* 12.0 - 15.0 g/dL Final     DELTA CHECK NOTED     REPEATED TO VERIFY     HCT  Date Value Range Status  07/12/2012 28.5* 36.0 - 46.0 % Final    Physical Exam:  General: alert, cooperative and appears stated age 21: appropriate Uterine Fundus: firm Incision: n/a DVT Evaluation: No evidence of DVT seen on physical exam.  Discharge Diagnoses: Post-date pregnancy  Discharge Information: Date: 07/12/2012 Activity: pelvic rest Diet: routine Medications: Ibuprofen Condition: stable Instructions: refer to practice specific booklet Discharge to: home Follow-up Information    Follow up with [facility]. In 6 weeks.         Newborn Data: Live born female  Birth Weight: 6 lb 11.2 oz (3039 g) APGAR: 9, 9  Home with mother.  [facility] 07/12/2012, 7:35 PM

## 2012-07-12 NOTE — Progress Notes (Signed)
Post Partum Day 1 Subjective: no complaints, up ad lib, voiding and tolerating PO  Objective: Blood pressure 102/65, pulse 58, temperature 97.6 F (36.4 C), temperature source Oral, resp. rate 18, height 5\' 4"  (1.626 m), weight 82.555 kg (182 lb), last menstrual period 08/18/2011, SpO2 98.00%, unknown if currently breastfeeding.  Physical Exam:  General: alert, cooperative and no distress Lochia: appropriate Uterine Fundus: firm Incision: n/a DVT Evaluation: No evidence of DVT seen on physical exam. Negative Homan's sign. No cords or calf tenderness. No significant calf/ankle edema.   Basename 07/12/12 0606 07/10/12 1051  HGB 9.0* 10.9*  HCT 28.5* 34.5*    Assessment/Plan: Plan for discharge tomorrow and Contraception mirena, OP circumcision   LOS: 2 days   Marge Duncans 07/12/2012, 7:36 AM

## 2012-07-12 NOTE — Progress Notes (Signed)
2018 Pt signed discharge paperwork, hugs tags and bracelets matched.  Hugs tag removed in the nursery and Nurse Olegario Messier walked pt out with baby in carseat. Kathleen Patrick

## 2012-07-13 NOTE — Progress Notes (Signed)
Post discharge chart review completed.  

## 2013-09-15 ENCOUNTER — Encounter: Payer: Self-pay | Admitting: Nurse Practitioner

## 2013-09-15 ENCOUNTER — Ambulatory Visit (INDEPENDENT_AMBULATORY_CARE_PROVIDER_SITE_OTHER): Payer: Medicaid Other | Admitting: Nurse Practitioner

## 2013-09-15 VITALS — BP 122/78 | Ht 64.0 in | Wt 206.2 lb

## 2013-09-15 DIAGNOSIS — N926 Irregular menstruation, unspecified: Secondary | ICD-10-CM

## 2013-09-15 DIAGNOSIS — IMO0001 Reserved for inherently not codable concepts without codable children: Secondary | ICD-10-CM

## 2013-09-15 DIAGNOSIS — Z3049 Encounter for surveillance of other contraceptives: Secondary | ICD-10-CM

## 2013-09-15 DIAGNOSIS — Z3009 Encounter for other general counseling and advice on contraception: Secondary | ICD-10-CM

## 2013-09-15 MED ORDER — MEDROXYPROGESTERONE ACETATE 150 MG/ML IM SUSP
150.0000 mg | Freq: Once | INTRAMUSCULAR | Status: AC
  Administered 2013-09-15: 150 mg via INTRAMUSCULAR

## 2013-09-15 NOTE — Progress Notes (Signed)
Subjective:  Presents to discuss restarting Depo Provera. Has takent this without difficulty in the past. Has a 37 month old infant at home. Had STD testing during her pregnancy. Has been with current partner x 1 year. Not breastfeeding. No cycle for at least a month. No intercourse since birth of her child.  Objective:   BP 122/78  Ht 5\' 4"  (1.626 m)  Wt 206 lb 3.2 oz (93.532 kg)  BMI 35.38 kg/m2 NAD. Alert oriented. Lungs clear. Heart RRR. Urine HCG neg. Also started her cycle while in office.  Assessment: Other general counseling and advice for contraceptive management  Birth control - Plan: POCT urine pregnancy, medroxyPROGESTERone (DEPO-PROVERA) injection 150 mg  Plan:  Meds ordered this encounter  Medications  . medroxyPROGESTERone (DEPO-PROVERA) injection 150 mg    Sig:    Continue MVI as directed. Next injection in 3 months. Call back if any problems.

## 2013-12-01 ENCOUNTER — Ambulatory Visit (INDEPENDENT_AMBULATORY_CARE_PROVIDER_SITE_OTHER): Payer: Medicaid Other | Admitting: Adult Health

## 2013-12-01 ENCOUNTER — Encounter (INDEPENDENT_AMBULATORY_CARE_PROVIDER_SITE_OTHER): Payer: Self-pay

## 2013-12-01 ENCOUNTER — Encounter: Payer: Self-pay | Admitting: Adult Health

## 2013-12-01 ENCOUNTER — Ambulatory Visit: Payer: Medicaid Other | Admitting: Family Medicine

## 2013-12-01 VITALS — BP 146/78 | Ht 64.0 in | Wt 215.0 lb

## 2013-12-01 DIAGNOSIS — N926 Irregular menstruation, unspecified: Secondary | ICD-10-CM

## 2013-12-01 DIAGNOSIS — Z3202 Encounter for pregnancy test, result negative: Secondary | ICD-10-CM

## 2013-12-01 DIAGNOSIS — R1031 Right lower quadrant pain: Secondary | ICD-10-CM

## 2013-12-01 HISTORY — DX: Irregular menstruation, unspecified: N92.6

## 2013-12-01 HISTORY — DX: Right lower quadrant pain: R10.31

## 2013-12-01 LAB — POCT URINE PREGNANCY: Preg Test, Ur: NEGATIVE

## 2013-12-01 NOTE — Patient Instructions (Signed)
Pelvic Pain, Female Female pelvic pain can be caused by many different things and start from a variety of places. Pelvic pain refers to pain that is located in the lower half of the abdomen and between your hips. The pain may occur over a short period of time (acute) or may be reoccurring (chronic). The cause of pelvic pain may be related to disorders affecting the female reproductive organs (gynecologic), but it may also be related to the bladder, kidney stones, an intestinal complication, or muscle or skeletal problems. Getting help right away for pelvic pain is important, especially if there has been severe, sharp, or a sudden onset of unusual pain. It is also important to get help right away because some types of pelvic pain can be life threatening.  CAUSES  Below are only some of the causes of pelvic pain. The causes of pelvic pain can be in one of several categories.   Gynecologic.  Pelvic inflammatory disease.  Sexually transmitted infection.  Ovarian cyst or a twisted ovarian ligament (ovarian torsion).  Uterine lining that grows outside the uterus (endometriosis).  Fibroids, cysts, or tumors.  Ovulation.  Pregnancy.  Pregnancy that occurs outside the uterus (ectopic pregnancy).  Miscarriage.  Labor.  Abruption of the placenta or ruptured uterus.  Infection.  Uterine infection (endometritis).  Bladder infection.  Diverticulitis.  Miscarriage related to a uterine infection (septic abortion).  Bladder.  Inflammation of the bladder (cystitis).  Kidney stone(s).  Gastrointenstinal.  Constipation.  Diverticulitis.  Neurologic.  Trauma.  Feeling pelvic pain because of mental or emotional causes (psychosomatic).  Cancers of the bowel or pelvis. EVALUATION  Your caregiver will want to take a careful history of your concerns. This includes recent changes in your health, a careful gynecologic history of your periods (menses), and a sexual history. Obtaining  your family history and medical history is also important. Your caregiver may suggest a pelvic exam. A pelvic exam will help identify the location and severity of the pain. It also helps in the evaluation of which organ system may be involved. In order to identify the cause of the pelvic pain and be properly treated, your caregiver may order tests. These tests may include:   A pregnancy test.  Pelvic ultrasonography.  An X-ray exam of the abdomen.  A urinalysis or evaluation of vaginal discharge.  Blood tests. HOME CARE INSTRUCTIONS   Only take over-the-counter or prescription medicines for pain, discomfort, or fever as directed by your caregiver.   Rest as directed by your caregiver.   Eat a balanced diet.   Drink enough fluids to make your urine clear or pale yellow, or as directed.   Avoid sexual intercourse if it causes pain.   Apply warm or cold compresses to the lower abdomen depending on which one helps the pain.   Avoid stressful situations.   Keep a journal of your pelvic pain. Write down when it started, where the pain is located, and if there are things that seem to be associated with the pain, such as food or your menstrual cycle.  Follow up with your caregiver as directed.  SEEK MEDICAL CARE IF:  Your medicine does not help your pain.  You have abnormal vaginal discharge. SEEK IMMEDIATE MEDICAL CARE IF:   You have heavy bleeding from the vagina.   Your pelvic pain increases.   You feel lightheaded or faint.   You have chills.   You have pain with urination or blood in your urine.   You have uncontrolled  diarrhea or vomiting.   You have a fever or persistent symptoms for more than 3 days.  You have a fever and your symptoms suddenly get worse.   You are being physically or sexually abused.  MAKE SURE YOU:  Understand these instructions.  Will watch your condition.  Will get help if you are not doing well or get worse. Document  Released: 08/20/2004 Document Revised: 03/24/2012 Document Reviewed: 01/13/2012 [facility] Patient Information 2014 [place], [place]. Follow up in 1 week  For US and see me

## 2013-12-01 NOTE — Progress Notes (Signed)
Subjective:     Patient ID: Kathleen Patrick, female   DOB: December 13, 1990, 23 y.o.   MRN: [id number]  HPI Kathleen Patrick is a 23 year old white female, single in complaining of irregular bleeding and pain in abdomen x about 1.5 weeks.She had a vaginal delivery 07/2013.And is on depo, got first shot in December.The bleeding has been on since shot til earlier this week.No vomiting, some diarrhea, no fever.   Review of Systems See HPI Reviewed past medical,surgical, social and family history. Reviewed medications and allergies.     Objective:   Physical Exam BP 146/78  Ht 5\' 4"  (1.626 m)  Wt 215 lb (97.523 kg)  BMI 36.89 kg/m2  LMP 09/15/2013  Breastfeeding? NoUPT negative Skin warm and dry.Pelvic: external genitalia is normal in appearance, vagina: scant discharge without odor, cervix:smooth and bulbous, uterus: normal size, shape and contour, non tender, no masses felt, adnexa: no masses, has some tenderness noted over right ovary.    Assessment:    Irregular bleeding with depo RLQ pain, ?ovary cyst    Plan:    Review handout on pelvic pain Return in 1 week for US and see me

## 2013-12-09 ENCOUNTER — Other Ambulatory Visit: Payer: Self-pay | Admitting: Adult Health

## 2013-12-09 DIAGNOSIS — N938 Other specified abnormal uterine and vaginal bleeding: Secondary | ICD-10-CM

## 2013-12-09 DIAGNOSIS — N949 Unspecified condition associated with female genital organs and menstrual cycle: Secondary | ICD-10-CM

## 2013-12-10 ENCOUNTER — Encounter: Payer: Self-pay | Admitting: Adult Health

## 2013-12-10 ENCOUNTER — Ambulatory Visit (INDEPENDENT_AMBULATORY_CARE_PROVIDER_SITE_OTHER): Payer: Medicaid Other

## 2013-12-10 ENCOUNTER — Ambulatory Visit (INDEPENDENT_AMBULATORY_CARE_PROVIDER_SITE_OTHER): Payer: Medicaid Other | Admitting: Adult Health

## 2013-12-10 VITALS — BP 110/86 | Ht 64.0 in | Wt 219.0 lb

## 2013-12-10 DIAGNOSIS — N925 Other specified irregular menstruation: Secondary | ICD-10-CM

## 2013-12-10 DIAGNOSIS — Z309 Encounter for contraceptive management, unspecified: Secondary | ICD-10-CM

## 2013-12-10 DIAGNOSIS — N926 Irregular menstruation, unspecified: Secondary | ICD-10-CM

## 2013-12-10 DIAGNOSIS — Z3049 Encounter for surveillance of other contraceptives: Secondary | ICD-10-CM

## 2013-12-10 DIAGNOSIS — N949 Unspecified condition associated with female genital organs and menstrual cycle: Secondary | ICD-10-CM

## 2013-12-10 DIAGNOSIS — N938 Other specified abnormal uterine and vaginal bleeding: Secondary | ICD-10-CM

## 2013-12-10 MED ORDER — NORGESTIMATE-ETH ESTRADIOL 0.25-35 MG-MCG PO TABS: 1.0000 | ORAL_TABLET | Freq: Every day | ORAL | Status: DC

## 2013-12-10 NOTE — Progress Notes (Signed)
Subjective:     Patient ID: Kathleen Patrick, female   DOB: 03-Dec-1990, 23 y.o.   MRN: [id number]  HPI Santina EvansCatherine is in for a US, for RLQ pain and irregular bleeding on depo, has breast tenderness and feels pregnant.  Review of Systems See HPI Reviewed past medical,surgical, social and family history. Reviewed medications and allergies.     Objective:   Physical Exam BP 110/86  Ht 5\' 4"  (1.626 m)  Wt 219 lb (99.338 kg)  BMI 37.57 kg/m2  LMP 09/15/2013   reviewed US with pt. Uterus 8.2 x 4.9 x 4.3 cm, no myometrial masses noted  Endometrium 6.1 mm, symmetrical,  Right ovary 3.9 x 2.1 x 2.0 cm,  Left ovary 4.1 x 2.2 x 1.7 cm,  No free fluid or adnexal masses noted  Technician Comments:  Transvaginal u/s performed, anteverted uterus, no myometrial masses noted, Endom-6.71mm, bilateral adnexa and ovaries appears wnl Will stop depo and try OCs  Assessment:     Irregular bleeding Contraceptive management History of RLQ pain     Plan:       Rx sprintec 1 daily with 11 refills, start Sunday Follow up in 4 weeks    Call prn Review handout on contraceptive use

## 2013-12-10 NOTE — Patient Instructions (Signed)
Oral Contraception Use Oral contraceptive pills (OCPs) are medicines taken to prevent pregnancy. OCPs work by preventing the ovaries from releasing eggs. The hormones in OCPs also cause the cervical mucus to thicken, preventing the sperm from entering the uterus. The hormones also cause the uterine lining to become thin, not allowing a fertilized egg to attach to the inside of the uterus. OCPs are highly effective when taken exactly as prescribed. However, OCPs do not prevent sexually transmitted diseases (STDs). Safe sex practices, such as using condoms along with an OCP, can help prevent STDs. Before taking OCPs, you may have a physical exam and Pap test. Your health care provider may also order blood tests if necessary. Your health care provider will make sure you are a good candidate for oral contraception. Discuss with your health care provider the possible side effects of the OCP you may be prescribed. When starting an OCP, it can take 2 to 3 months for the body to adjust to the changes in hormone levels in your body.  HOW TO TAKE ORAL CONTRACEPTIVE PILLS Your health care provider may advise you on how to start taking the first cycle of OCPs. Otherwise, you can:   Start on day 1 of your menstrual period. You will not need any backup contraceptive protection with this start time.   Start on the first Sunday after your menstrual period or the day you get your prescription. In these cases, you will need to use backup contraceptive protection for the first week.   Start the pill at any time of your cycle. If you take the pill within 5 days of the start of your period, you are protected against pregnancy right away. In this case, you will not need a backup form of birth control. If you start at any other time of your menstrual cycle, you will need to use another form of birth control for 7 days. If your OCP is the type called a minipill, it will protect you from pregnancy after taking it for 2 days (48  hours). After you have started taking OCPs:   If you forget to take 1 pill, take it as soon as you remember. Take the next pill at the regular time.   If you miss 2 or more pills, call your health care provider because different pills have different instructions for missed doses. Use backup birth control until your next menstrual period starts.   If you use a 28-day pack that contains inactive pills and you miss 1 of the last 7 pills (pills with no hormones), it will not matter. Throw away the rest of the nonhormone pills and start a new pill pack.  No matter which day you start the OCP, you will always start a new pack on that same day of the week. Have an extra pack of OCPs and a backup contraceptive method available in case you miss some pills or lose your OCP pack.  HOME CARE INSTRUCTIONS   Do not smoke.   Always use a condom to protect against STDs. OCPs do not protect against STDs.   Use a calendar to mark your menstrual period days.   Read the information and directions that came with your OCP. Talk to your health care provider if you have questions.  SEEK MEDICAL CARE IF:   You develop nausea and vomiting.   You have abnormal vaginal discharge or bleeding.   You develop a rash.   You miss your menstrual period.   You are losing   your hair.   You need treatment for mood swings or depression.   You get dizzy when taking the OCP.   You develop acne from taking the OCP.   You become pregnant.  SEEK IMMEDIATE MEDICAL CARE IF:   You develop chest pain.   You develop shortness of breath.   You have an uncontrolled or severe headache.   You develop numbness or slurred speech.   You develop visual problems.   You develop pain, redness, and swelling in the legs.  Document Released: 09/12/2011 Document Revised: 05/26/2013 Document Reviewed: 03/14/2013 [facility] Patient Information 2014 [place], [place]. Start pills Sunday Follow up in 4 weeks

## 2014-01-12 ENCOUNTER — Ambulatory Visit (INDEPENDENT_AMBULATORY_CARE_PROVIDER_SITE_OTHER): Payer: Medicaid Other | Admitting: Nurse Practitioner

## 2014-01-12 ENCOUNTER — Encounter: Payer: Self-pay | Admitting: Nurse Practitioner

## 2014-01-12 VITALS — BP 138/86 | Temp 98.6°F | Ht 64.0 in | Wt 224.0 lb

## 2014-01-12 DIAGNOSIS — L259 Unspecified contact dermatitis, unspecified cause: Secondary | ICD-10-CM

## 2014-01-12 DIAGNOSIS — L239 Allergic contact dermatitis, unspecified cause: Secondary | ICD-10-CM

## 2014-01-12 MED ORDER — PREDNISONE 20 MG PO TABS: ORAL_TABLET | ORAL | Status: DC

## 2014-01-12 MED ORDER — HYDROXYZINE HCL 10 MG PO TABS
10.0000 mg | ORAL_TABLET | Freq: Every evening | ORAL | Status: DC | PRN
Start: 2014-01-12 — End: 2014-06-08

## 2014-01-12 NOTE — Patient Instructions (Signed)
Loratadine or fexofenadine each morning Atarax at night Zantac as directed

## 2014-01-14 ENCOUNTER — Encounter: Payer: Self-pay | Admitting: Nurse Practitioner

## 2014-01-14 ENCOUNTER — Ambulatory Visit: Payer: Medicaid Other | Admitting: Adult Health

## 2014-01-14 NOTE — Progress Notes (Signed)
Subjective:  Presents for c/o rash x 2 weeks. Began on leg, now mainly on all extremities, slightly on abd. Very pruritic. No known allergens. No change in hygiene products. No fever or URI symptoms. No wheezing. Concerned because she was exposed to [employer] at work.  Objective:   BP 138/86  Temp(Src) 98.6 F (37 C) (Oral)  Ht 5\' 4"  (1.626 m)  Wt 224 lb (101.606 kg)  BMI 38.43 kg/m2 NAD. Alert, oriented. TMs mild clear effusion. Pharynx clear. Neck supple with mild soft nontender anterior adenopathy. Lungs clear. Heart RRR. Multiple fine pink papules noted particularly on the arms with scattered areas on the legs. Minimal involvement of the abdomen. No lesions on the face. No pustules noted.  Assessment: Allergic dermatitis   Plan: Meds ordered this encounter  Medications  . predniSONE (DELTASONE) 20 MG tablet    Sig: 3 po qd x 3 d then 2 po qd x 3 d then 1 po qd x 3 d    Dispense:  18 tablet    Refill:  0    Order Specific Question:  Supervising Provider    Answer:  Merlyn AlbertLUKING, WILLIAM S [[id number]]  . hydrOXYzine (ATARAX/VISTARIL) 10 MG tablet    Sig: Take 1 tablet (10 mg total) by mouth at bedtime as needed for itching.    Dispense:  30 tablet    Refill:  0    Order Specific Question:  Supervising Provider    Answer:  Merlyn AlbertLUKING, WILLIAM S [[id number]]   Nonsedating antihistamine in the morning, Atarax at night. May use OTC hydrocortisone cream twice a day. Call back in 4-5 days if no improvement, sooner if worse.

## 2014-02-04 ENCOUNTER — Encounter: Payer: Self-pay | Admitting: Nurse Practitioner

## 2014-02-04 ENCOUNTER — Ambulatory Visit (INDEPENDENT_AMBULATORY_CARE_PROVIDER_SITE_OTHER): Payer: Medicaid Other | Admitting: Nurse Practitioner

## 2014-02-04 VITALS — BP 128/82 | Ht 64.0 in | Wt 228.8 lb

## 2014-02-04 DIAGNOSIS — R21 Rash and other nonspecific skin eruption: Secondary | ICD-10-CM

## 2014-02-04 MED ORDER — LORATADINE 10 MG PO TABS: 10.0000 mg | ORAL_TABLET | Freq: Every day | ORAL | Status: DC

## 2014-02-04 MED ORDER — AMOXICILLIN-POT CLAVULANATE 875-125 MG PO TABS: 1.0000 | ORAL_TABLET | Freq: Two times a day (BID) | ORAL | Status: DC

## 2014-02-04 MED ORDER — PERMETHRIN 5 % EX CREA: 1.0000 "application " | TOPICAL_CREAM | Freq: Once | CUTANEOUS | Status: DC

## 2014-02-06 ENCOUNTER — Encounter: Payer: Self-pay | Admitting: Nurse Practitioner

## 2014-02-06 NOTE — Progress Notes (Signed)
Subjective:  Presents for recheck of her rash. Seen in our office on 4/8 [facility] after 2 courses of steroids. Began in March. No fever. Slightly tender, burning itchy rash which has spread particularly on extremities. Worse area on upper thighs. No known allergens. No known contacts. No wheezing. No edema.   Objective:   BP 128/82  Ht 5\' 4"  (1.626 m)  Wt 228 lb 12.8 oz (103.783 kg)  BMI 39.25 kg/m2 NAD. Alert, oriented. Lungs clear. Heart RRR. Diffuse light pink maculopapular rash noted over the body; more erythematous with excoriation on upper thigh area. A few lesions have a tiny white center.  Assessment: Rash and nonspecific skin eruption question possible scabies/early steroid induced folliculitis Plan:  Meds ordered this encounter  Medications  . amoxicillin-clavulanate (AUGMENTIN) 875-125 MG per tablet    Sig: Take 1 tablet by mouth 2 (two) times daily.    Dispense:  14 tablet    Refill:  0    Order Specific Question:  Supervising Provider    Answer:  Merlyn AlbertLUKING, WILLIAM S [[id number]]  . permethrin (ELIMITE) 5 % cream    Sig: Apply 1 application topically once.    Dispense:  60 g    Refill:  0    Order Specific Question:  Supervising Provider    Answer:  Merlyn AlbertLUKING, WILLIAM S [[id number]]  . loratadine (CLARITIN) 10 MG tablet    Sig: Take 1 tablet (10 mg total) by mouth daily.    Dispense:  30 tablet    Refill:  11    Order Specific Question:  Supervising Provider    Answer:  Merlyn AlbertLUKING, WILLIAM S [[id number]]  Loratadine in the morning with Benadryl at night. Will refer to dermatology. Call back sooner if worsens.

## 2014-06-07 ENCOUNTER — Telehealth: Payer: Self-pay | Admitting: Adult Health

## 2014-06-07 NOTE — Telephone Encounter (Signed)
Spoke with pt. Pt states she is late on her period and she is cramping. Has had a neg home pregnancy test. Appt scheduled to be seen. JSY

## 2014-06-08 ENCOUNTER — Ambulatory Visit (INDEPENDENT_AMBULATORY_CARE_PROVIDER_SITE_OTHER): Payer: Medicaid Other | Admitting: Adult Health

## 2014-06-08 ENCOUNTER — Encounter: Payer: Self-pay | Admitting: Adult Health

## 2014-06-08 VITALS — BP 130/80 | Ht 64.0 in | Wt 224.0 lb

## 2014-06-08 DIAGNOSIS — R42 Dizziness and giddiness: Secondary | ICD-10-CM

## 2014-06-08 DIAGNOSIS — Z3009 Encounter for other general counseling and advice on contraception: Secondary | ICD-10-CM

## 2014-06-08 DIAGNOSIS — Z309 Encounter for contraceptive management, unspecified: Secondary | ICD-10-CM | POA: Insufficient documentation

## 2014-06-08 DIAGNOSIS — Z30018 Encounter for initial prescription of other contraceptives: Secondary | ICD-10-CM

## 2014-06-08 DIAGNOSIS — Z3202 Encounter for pregnancy test, result negative: Secondary | ICD-10-CM

## 2014-06-08 HISTORY — DX: Dizziness and giddiness: R42

## 2014-06-08 LAB — POCT URINE PREGNANCY: PREG TEST UR: NEGATIVE

## 2014-06-08 MED ORDER — ETONOGESTREL-ETHINYL ESTRADIOL 0.12-0.015 MG/24HR VA RING: VAGINAL_RING | VAGINAL | Status: DC

## 2014-06-08 NOTE — Progress Notes (Signed)
Subjective:     Patient ID: Kathleen Patrick, female   DOB: 1991/03/28, 23 y.o.   MRN: [id number]  HPI Kathleen Patrick is a 23 year old white female in to discuss change birth control and some cramps and dizzy spells.She has tried depo,IUD and other OCs, she had wanted BTL but had reaction to anesthesia and could not get tubal.  Review of Systems See HPI   Reviewed past medical,surgical, social and family history. Reviewed medications and allergies.  Objective:   Physical Exam BP 130/80  Ht  (1.626 m)  Wt 224 lb (101.606 kg)  BMI 38.43 kg/m2  LMP 05/01/2014  Breastfeeding? NoUPT negative, Skin warm and dry.Pelvic: external genitalia is normal in appearance, vagina: scant discharge without odor, cervix:smooth and bulbous, uterus: normal size, shape and contour, non tender, no masses felt, adnexa: no masses or tenderness noted.   Nuva ring inserted.Says husband will get vasectomy but costs too much at present.  Assessment:    Contraceptive management Dizzy spells    Plan:    Get partner to get family planning medicaid for vasectomy. Check CBC,CMP,TSH Rx nuva ring use 1 ring for 25 days then remove for 3 days and put new ring in, refilled x 11, sample in today lot [id number] exp 7/17 Return in 3 months for pap and physical   Review handout on nuva ring

## 2014-06-08 NOTE — Patient Instructions (Signed)

## 2014-06-09 LAB — TSH: TSH: 1.408 u[IU]/mL (ref 0.350–4.500)

## 2014-06-09 LAB — COMPREHENSIVE METABOLIC PANEL
ALK PHOS: 81 U/L (ref 39–117)
ALT: 11 U/L (ref 0–35)
AST: 13 U/L (ref 0–37)
Albumin: 4.1 g/dL (ref 3.5–5.2)
BUN: 10 mg/dL (ref 6–23)
CHLORIDE: 106 meq/L (ref 96–112)
CO2: 26 meq/L (ref 19–32)
CREATININE: 0.65 mg/dL (ref 0.50–1.10)
Calcium: 9.5 mg/dL (ref 8.4–10.5)
Glucose, Bld: 96 mg/dL (ref 70–99)
POTASSIUM: 4 meq/L (ref 3.5–5.3)
SODIUM: 138 meq/L (ref 135–145)
Total Bilirubin: 0.3 mg/dL (ref 0.2–1.2)
Total Protein: 7 g/dL (ref 6.0–8.3)

## 2014-06-09 LAB — CBC
HCT: 42.7 % (ref 36.0–46.0)
Hemoglobin: 14.3 g/dL (ref 12.0–15.0)
MCH: 28.9 pg (ref 26.0–34.0)
MCHC: 33.5 g/dL (ref 30.0–36.0)
MCV: 86.4 fL (ref 78.0–100.0)
PLATELETS: 150 10*3/uL (ref 150–400)
RBC: 4.94 MIL/uL (ref 3.87–5.11)
RDW: 13.5 % (ref 11.5–15.5)
WBC: 6.6 10*3/uL (ref 4.0–10.5)

## 2014-06-14 ENCOUNTER — Telehealth: Payer: Self-pay | Admitting: Adult Health

## 2014-06-14 NOTE — Telephone Encounter (Signed)
Pt aware labs normal, see PCP about dizzy spells could be inner ear

## 2014-08-08 ENCOUNTER — Encounter: Payer: Self-pay | Admitting: Adult Health

## 2014-09-07 ENCOUNTER — Encounter: Payer: Self-pay | Admitting: Adult Health

## 2014-09-07 ENCOUNTER — Other Ambulatory Visit: Payer: Medicaid Other | Admitting: Adult Health

## 2014-09-19 ENCOUNTER — Encounter: Payer: Self-pay | Admitting: Family Medicine

## 2014-09-19 ENCOUNTER — Ambulatory Visit (INDEPENDENT_AMBULATORY_CARE_PROVIDER_SITE_OTHER): Payer: Self-pay | Admitting: Family Medicine

## 2014-09-19 VITALS — Temp 99.0°F | Ht 64.0 in | Wt 214.0 lb

## 2014-09-19 DIAGNOSIS — J029 Acute pharyngitis, unspecified: Secondary | ICD-10-CM

## 2014-09-19 LAB — POCT RAPID STREP A (OFFICE): RAPID STREP A SCREEN: POSITIVE — AB

## 2014-09-19 MED ORDER — AZITHROMYCIN 250 MG PO TABS: ORAL_TABLET | ORAL | Status: DC

## 2014-09-19 MED ORDER — HYDROCODONE-ACETAMINOPHEN 5-325 MG PO TABS: 1.0000 | ORAL_TABLET | Freq: Four times a day (QID) | ORAL | Status: DC | PRN

## 2014-09-19 NOTE — Progress Notes (Signed)
   Subjective:    Patient ID: Kathleen Patrick, female    DOB: 1991/09/24, 23 y.o.   MRN: [id number]  Sore Throat  This is a new problem. The current episode started yesterday. The problem has been gradually worsening. Associated symptoms include headaches and trouble swallowing. Pertinent negatives include no congestion, ear pain or shortness of breath. Treatments tried: benadryl  The treatment provided mild relief.   Patient seen after hours to prevent ER visit PMH benign  Review of Systems  Constitutional: Negative for fever and activity change.  HENT: Positive for sore throat and trouble swallowing. Negative for congestion, ear pain and rhinorrhea.   Eyes: Negative for discharge.  Respiratory: Negative for shortness of breath and wheezing.   Cardiovascular: Negative for chest pain.  Neurological: Positive for headaches.       Objective:   Physical Exam  Constitutional: She appears well-developed.  HENT:  Head: Normocephalic.  Right Ear: External ear normal.  Left Ear: External ear normal.  Nose: Nose normal.  Mouth/Throat: Oropharyngeal exudate present.  Neck: Neck supple.  Cardiovascular: Normal rate and normal heart sounds.   No murmur heard. Pulmonary/Chest: Effort normal and breath sounds normal. She has no wheezes.  Lymphadenopathy:    She has no cervical adenopathy.  Skin: Skin is warm and dry.  Nursing note and vitals reviewed.         Assessment & Plan:  Positive strep test, Z-Pak prescribed, Vicodin for pain, warning signs discussed.

## 2014-09-19 NOTE — Patient Instructions (Signed)

## 2014-09-21 ENCOUNTER — Encounter: Payer: Self-pay | Admitting: Adult Health

## 2014-09-21 ENCOUNTER — Other Ambulatory Visit: Payer: Medicaid Other | Admitting: Adult Health

## 2014-10-21 ENCOUNTER — Other Ambulatory Visit: Payer: Self-pay | Admitting: Nurse Practitioner

## 2014-10-21 MED ORDER — SULFAMETHOXAZOLE-TRIMETHOPRIM 800-160 MG PO TABS: 1.0000 | ORAL_TABLET | Freq: Two times a day (BID) | ORAL | Status: DC

## 2014-10-21 NOTE — Progress Notes (Signed)
In with her son who has MRSA. Mother now has infected knots under arms (has had infections before). Sore under right arm has been draining since last night. No fever. Recheck next week if no better.

## 2015-04-18 ENCOUNTER — Encounter: Payer: Self-pay | Admitting: Adult Health

## 2015-04-18 ENCOUNTER — Other Ambulatory Visit (HOSPITAL_COMMUNITY)
Admission: RE | Admit: 2015-04-18 | Discharge: 2015-04-18 | Disposition: A | Discharge status: Home / Self Care | Payer: Medicaid Other | Source: Ambulatory Visit | Attending: Obstetrics and Gynecology | Admitting: Obstetrics and Gynecology

## 2015-04-18 ENCOUNTER — Ambulatory Visit (INDEPENDENT_AMBULATORY_CARE_PROVIDER_SITE_OTHER): Payer: Medicaid Other | Admitting: Adult Health

## 2015-04-18 VITALS — BP 122/78 | HR 60 | Ht 64.0 in | Wt 221.0 lb

## 2015-04-18 DIAGNOSIS — Z3009 Encounter for other general counseling and advice on contraception: Secondary | ICD-10-CM | POA: Diagnosis not present

## 2015-04-18 DIAGNOSIS — Z113 Encounter for screening for infections with a predominantly sexual mode of transmission: Secondary | ICD-10-CM | POA: Insufficient documentation

## 2015-04-18 DIAGNOSIS — Z01419 Encounter for gynecological examination (general) (routine) without abnormal findings: Secondary | ICD-10-CM | POA: Insufficient documentation

## 2015-04-18 DIAGNOSIS — Z319 Encounter for procreative management, unspecified: Secondary | ICD-10-CM

## 2015-04-18 DIAGNOSIS — R1031 Right lower quadrant pain: Secondary | ICD-10-CM

## 2015-04-18 MED ORDER — PRENATAL PLUS 27-1 MG PO TABS: 1.0000 | ORAL_TABLET | Freq: Every day | ORAL | Status: DC

## 2015-04-18 NOTE — Patient Instructions (Signed)
Take prenatal vitamins Keep event calendar if pain in right side  Have sex days 7-24 of cycle Pee before sex and lay there 30 minutes after sex Preparing for Pregnancy Before trying to become pregnant, make an appointment with your health care provider (preconception care). The goal is to help you have a healthy, safe pregnancy. At your first appointment, your health care provider will:   Do a complete physical exam, including a Pap test.  Take a complete medical history.  Give you advice and help you resolve any problems. PRECONCEPTION CHECKLIST Here is a list of the basics to cover with your health care provider at your preconception visit:  Medical history.  Tell your health care provider about any diseases you have had. Many diseases can affect your pregnancy.  Include your partner's medical history and family history.  Make sure you have been tested for sexually transmitted infections (STIs). These can affect your pregnancy. In some cases, they can be passed to your baby. Tell your health care provider about any history of STIs.  Make sure your health care provider knows about any previous problems you have had with conception or pregnancy.  Tell your health care provider about any medicine you take. This includes herbal supplements and over-the-counter medicines.  Make sure all your immunizations are up to date. You may need to make additional appointments.  Ask your health care provider if you need any vaccinations or if there are any you should avoid.  Diet.  It is especially important to eat a healthy, balanced diet with the right nutrients when you are pregnant.  Ask your health care provider to help you get to a healthy weight before pregnancy.  If you are overweight, you are at higher risk for certain complications. These include high blood pressure, diabetes, and preterm birth.  If you are underweight, you are more likely to have a low-birth-weight  baby.  Lifestyle.  Tell your health care provider about lifestyle factors such as alcohol use, drug use, or smoking.  Describe any harmful substances you may be exposed to at work or home. These can include chemicals, pesticides, and radiation.  Mental health.  Let your health care provider know if you have been feeling depressed or anxious.  Let your health care provider know if you have a history of substance abuse.  Let your health care provider know if you do not feel safe at home. HOME INSTRUCTIONS TO PREPARE FOR PREGNANCY Follow your health care provider's advice and instructions.   Keep an accurate record of your menstrual periods. This makes it easier for your health care provider to determine your baby's due date.  Begin taking prenatal vitamins and folic acid supplements daily. Take them as directed by your health care provider.  Eat a balanced diet. Get help from a nutrition counselor if you have questions or need help.  Get regular exercise. Try to be active for at least 30 minutes a day most days of the week.  Quit smoking, if you smoke.  Do not drink alcohol.  Do not take illegal drugs.  Get medical problems, such as diabetes or high blood pressure, under control.  If you have diabetes, make sure you do the following:  Have good blood sugar control. If you have type 1 diabetes, use multiple daily doses of insulin. Do not use split-dose or premixed insulin.  Have an eye exam by a qualified eye care professional trained in caring for people with diabetes.  Get evaluated by your  health care provider for cardiovascular disease.  Get to a healthy weight. If you are overweight or obese, reduce your weight with the help of a qualified health professional such as a Museum/gallery exhibitions officerregistered dietitian. Ask your health care provider what the right weight range is for you. HOW DO I KNOW I AM PREGNANT? You may be pregnant if you have been sexually active and you miss your period.  Symptoms of early pregnancy include:   Mild cramping.  Very light vaginal bleeding (spotting).  Feeling unusually tired.  Morning sickness. If you have any of these symptoms, take a home pregnancy test. These tests look for a hormone called human chorionic gonadotropin (hCG) in your urine. Your body begins to make this hormone during early pregnancy. These tests are very accurate. Wait until at least the first day you miss your period to take one. If you get a positive result, call your health care provider to make appointments for prenatal care. WHAT SHOULD I DO IF I BECOME PREGNANT?  Make an appointment with your health care provider by week 12 of your pregnancy at the latest.  Do not smoke. Smoking can be harmful to your baby.  Do not drink alcoholic beverages. Alcohol is related to a number of birth defects.  Avoid toxic odors and chemicals.  You may continue to have sexual intercourse if it does not cause pain or other problems, such as vaginal bleeding. Document Released: 09/05/2008 Document Revised: 02/07/2014 Document Reviewed: 08/30/2013 [facility] Patient Information 2015 [place], [place]. This information is not intended to replace advice given to you by your health care provider. Make sure you discuss any questions you have with your health care provider.

## 2015-04-18 NOTE — Progress Notes (Signed)
Patient ID: Kathleen Patrick, female   DOB: [birth year], 24 y.o.   MRN: [id number] History of Present Illness:  Kathleen Patrick is a 24 year old white female, married in for well woman gyn exam and pap, and she wants to be pregnant. Has family planning medicaid.  Current Medications, Allergies, Past Medical History, Past Surgical History, Family History and Social History [facility].     Review of Systems: Patient denies any headaches, hearing loss, fatigue, blurred vision, shortness of breath, chest pain or problems with bowel movements, urination, or intercourse. No joint pain or mood swings.She has RLQ pain but has had on and off for a while and had normal US last year.Her periods are regular and she has been trying for about 3 months to get pregnant.    Physical Exam:BP 122/78 mmHg  Pulse 60  Ht 5\' 4"  (1.626 m)  Wt 221 lb (100.245 kg)  BMI 37.92 kg/m2  LMP 03/28/2015 General:  Well developed, well nourished, no acute distress Skin:  Warm and dry Neck:  Midline trachea, normal thyroid, good ROM, no lymphadenopathy Lungs; Clear to auscultation bilaterally Breast:  No dominant palpable mass, retraction, or nipple discharge Cardiovascular: Regular rate and rhythm Abdomen:  Soft, non tender, no hepatosplenomegaly Pelvic:  External genitalia is normal in appearance, no lesions.  The vagina is normal in appearance. Urethra has no lesions or masses. The cervix is bulbous. Pap with GC/CHl performed. Uterus is felt to be normal size, shape, and contour.  No adnexal masses, RLQ tenderness noted.Bladder is non tender, no masses felt. Extremities/musculoskeletal:  No swelling or varicosities noted, no clubbing or cyanosis Psych:  No mood changes, alert and cooperative,seems happy   Impression: Well woman gyn exam with pap, has family planning medicaid Desires pregnancy RLQ pain chronic     Plan: Check HIV,RPR,and HSV 2 Rx prenatal plus #30 take  1 daily with 11 refills Review handout on preparing for pregnancy Have sex every other day day 7-24 of cycle Pee before sex and lay there 30 minutes after sex Keep event calendar of pain in right side, may be ovulation pain

## 2015-04-20 LAB — CYTOLOGY - PAP

## 2015-04-20 LAB — RPR: RPR Ser Ql: NONREACTIVE

## 2015-04-20 LAB — HSV 2 ANTIBODY, IGG

## 2015-04-20 LAB — HIV ANTIBODY (ROUTINE TESTING W REFLEX): HIV Screen 4th Generation wRfx: NONREACTIVE

## 2015-04-26 ENCOUNTER — Telehealth: Payer: Self-pay | Admitting: Adult Health

## 2015-04-26 NOTE — Telephone Encounter (Signed)
Spoke with pt letting her know all results were normal. Pt voiced understanding. JSY 

## 2017-03-07 ENCOUNTER — Ambulatory Visit: Payer: Self-pay | Admitting: Neurology

## 2017-03-07 ENCOUNTER — Telehealth: Payer: Self-pay | Admitting: Neurology

## 2017-03-07 NOTE — Telephone Encounter (Signed)
This patient did not show for a new patient appointment. 

## 2017-03-10 ENCOUNTER — Encounter: Payer: Self-pay | Admitting: Neurology

## 2018-08-05 ENCOUNTER — Encounter: Payer: Self-pay | Admitting: Adult Health

## 2018-08-05 ENCOUNTER — Ambulatory Visit: Payer: BLUE CROSS/BLUE SHIELD | Admitting: Adult Health

## 2018-08-05 ENCOUNTER — Encounter (INDEPENDENT_AMBULATORY_CARE_PROVIDER_SITE_OTHER): Payer: Self-pay

## 2018-08-05 ENCOUNTER — Encounter: Payer: Self-pay | Admitting: Family Medicine

## 2018-08-05 VITALS — BP 106/74 | HR 80 | Ht 64.0 in | Wt 220.4 lb

## 2018-08-05 DIAGNOSIS — F329 Major depressive disorder, single episode, unspecified: Secondary | ICD-10-CM | POA: Diagnosis not present

## 2018-08-05 DIAGNOSIS — O3680X0 Pregnancy with inconclusive fetal viability, not applicable or unspecified: Secondary | ICD-10-CM | POA: Insufficient documentation

## 2018-08-05 DIAGNOSIS — R12 Heartburn: Secondary | ICD-10-CM

## 2018-08-05 DIAGNOSIS — Z3201 Encounter for pregnancy test, result positive: Secondary | ICD-10-CM | POA: Diagnosis not present

## 2018-08-05 DIAGNOSIS — N926 Irregular menstruation, unspecified: Secondary | ICD-10-CM | POA: Diagnosis not present

## 2018-08-05 DIAGNOSIS — F32A Depression, unspecified: Secondary | ICD-10-CM | POA: Insufficient documentation

## 2018-08-05 DIAGNOSIS — Z3A01 Less than 8 weeks gestation of pregnancy: Secondary | ICD-10-CM | POA: Insufficient documentation

## 2018-08-05 DIAGNOSIS — O26891 Other specified pregnancy related conditions, first trimester: Secondary | ICD-10-CM | POA: Insufficient documentation

## 2018-08-05 LAB — POCT URINE PREGNANCY: Preg Test, Ur: POSITIVE — AB

## 2018-08-05 MED ORDER — OMEPRAZOLE 20 MG PO CPDR: 20.0000 mg | DELAYED_RELEASE_CAPSULE | Freq: Every day | ORAL | 6 refills | Status: DC

## 2018-08-05 MED ORDER — PRENATAL PLUS 27-1 MG PO TABS: 1.0000 | ORAL_TABLET | Freq: Every day | ORAL | 12 refills | Status: DC

## 2018-08-05 NOTE — Progress Notes (Signed)
  Subjective:     Patient ID: Kathleen Patrick, female   DOB: November 10, 1990, 27 y.o.   MRN: [id number]  HPI Kathleen Patrick is a 27 year old white female in for UPT, has missed a period and had +HPT.+heartburn, she works nights.  Review of Systems +missed period +heartburn Reviewed past medical,surgical, social and family history. Reviewed medications and allergies.     Objective:   Physical Exam BP 106/74 (BP Location: Left Arm, Patient Position: Sitting, Cuff Size: Large)   Pulse 80   Ht 5\' 4"  (1.626 m)   Wt 220 lb 6.4 oz (100 kg)   LMP 06/22/2018   BMI 37.83 kg/m UPT +, about 6+1 week by LMP with EDD 03/30/19.Skin warm and dry. Neck: mid line trachea, normal thyroid, good ROM, no lymphadenopathy noted. Lungs: clear to ausculation bilaterally. Cardiovascular: regular rate and rhythm. Abdomen is soft and non tender. PHQ 9 score is 10, denies being suicidal and has had depression since in teens, and declines meds.    Assessment:     1. Pregnancy test positive   2. Less than [redacted] weeks gestation of pregnancy   3. Encounter to determine fetal viability of pregnancy, single or unspecified fetus   4. Depression, unspecified depression type   5. Heartburn during pregnancy in first trimester       Plan:     Eat often Meds ordered this encounter  Medications  . prenatal vitamin w/FE, FA (PRENATAL 1 + 1) 27-1 MG TABS tablet    Sig: Take 1 tablet by mouth daily at 12 noon.    Dispense:  30 each    Refill:  12    Order Specific Question:   Supervising Provider    Answer:   Despina Hidden, LUTHER H [[id number]]  . omeprazole (PRILOSEC) 20 MG capsule    Sig: Take 1 capsule (20 mg total) by mouth daily.    Dispense:  30 capsule    Refill:  6    Order Specific Question:   Supervising Provider    Answer:   Duane Lope H [[id number]]  Return in 2 weeks for dating [place] and 4 weeks for new OB Review handouts on First Trimester and by [facility]

## 2018-08-05 NOTE — Patient Instructions (Signed)
First Trimester of Pregnancy The first trimester of pregnancy is from week 1 until the end of week 13 (months 1 through 3). A week after a sperm fertilizes an egg, the egg will implant on the wall of the uterus. This embryo will begin to develop into a baby. Genes from you and your partner will form the baby. The female genes will determine whether the baby will be a boy or a girl. At 6-8 weeks, the eyes and face will be formed, and the heartbeat can be seen on ultrasound. At the end of 12 weeks, all the baby's organs will be formed. Now that you are pregnant, you will want to do everything you can to have a healthy baby. Two of the most important things are to get good prenatal care and to follow your health care provider's instructions. Prenatal care is all the medical care you receive before the baby's birth. This care will help prevent, find, and treat any problems during the pregnancy and childbirth. Body changes during your first trimester Your body goes through many changes during pregnancy. The changes vary from woman to woman.  You may gain or lose a couple of pounds at first.  You may feel sick to your stomach (nauseous) and you may throw up (vomit). If the vomiting is uncontrollable, call your health care provider.  You may tire easily.  You may develop headaches that can be relieved by medicines. All medicines should be approved by your health care provider.  You may urinate more often. Painful urination may mean you have a bladder infection.  You may develop heartburn as a result of your pregnancy.  You may develop constipation because certain hormones are causing the muscles that push stool through your intestines to slow down.  You may develop hemorrhoids or swollen veins (varicose veins).  Your breasts may begin to grow larger and become tender. Your nipples may stick out more, and the tissue that surrounds them (areola) may become darker.  Your gums may bleed and may be  sensitive to brushing and flossing.  Dark spots or blotches (chloasma, mask of pregnancy) may develop on your face. This will likely fade after the baby is born.  Your menstrual periods will stop.  You may have a loss of appetite.  You may develop cravings for certain kinds of food.  You may have changes in your emotions from day to day, such as being excited to be pregnant or being concerned that something may go wrong with the pregnancy and baby.  You may have more vivid and strange dreams.  You may have changes in your hair. These can include thickening of your hair, rapid growth, and changes in texture. Some women also have hair loss during or after pregnancy, or hair that feels dry or thin. Your hair will most likely return to normal after your baby is born.  What to expect at prenatal visits During a routine prenatal visit:  You will be weighed to make sure you and the baby are growing normally.  Your blood pressure will be taken.  Your abdomen will be measured to track your baby's growth.  The fetal heartbeat will be listened to between weeks 10 and 14 of your pregnancy.  Test results from any previous visits will be discussed.  Your health care provider may ask you:  How you are feeling.  If you are feeling the baby move.  If you have had any abnormal symptoms, such as leaking fluid, bleeding, severe headaches,   or abdominal cramping.  If you are using any tobacco products, including cigarettes, chewing tobacco, and electronic cigarettes.  If you have any questions.  Other tests that may be performed during your first trimester include:  Blood tests to find your blood type and to check for the presence of any previous infections. The tests will also be used to check for low iron levels (anemia) and protein on red blood cells (Rh antibodies). Depending on your risk factors, or if you previously had diabetes during pregnancy, you may have tests to check for high blood  sugar that affects pregnant women (gestational diabetes).  Urine tests to check for infections, diabetes, or protein in the urine.  An ultrasound to confirm the proper growth and development of the baby.  Fetal screens for spinal cord problems (spina bifida) and Down syndrome.  HIV (human immunodeficiency virus) testing. Routine prenatal testing includes screening for HIV, unless you choose not to have this test.  You may need other tests to make sure you and the baby are doing well.  Follow these instructions at home: Medicines  Follow your health care provider's instructions regarding medicine use. Specific medicines may be either safe or unsafe to take during pregnancy.  Take a prenatal vitamin that contains at least 600 micrograms (mcg) of folic acid.  If you develop constipation, try taking a stool softener if your health care provider approves. Eating and drinking  Eat a balanced diet that includes fresh fruits and vegetables, whole grains, good sources of protein such as meat, eggs, or tofu, and low-fat dairy. Your health care provider will help you determine the amount of weight gain that is right for you.  Avoid raw meat and uncooked cheese. These carry germs that can cause birth defects in the baby.  Eating four or five small meals rather than three large meals a day may help relieve nausea and vomiting. If you start to feel nauseous, eating a few soda crackers can be helpful. Drinking liquids between meals, instead of during meals, also seems to help ease nausea and vomiting.  Limit foods that are high in fat and processed sugars, such as fried and sweet foods.  To prevent constipation: ? Eat foods that are high in fiber, such as fresh fruits and vegetables, whole grains, and beans. ? Drink enough fluid to keep your urine clear or pale yellow. Activity  Exercise only as directed by your health care provider. Most women can continue their usual exercise routine during  pregnancy. Try to exercise for 30 minutes at least 5 days a week. Exercising will help you: ? Control your weight. ? Stay in shape. ? Be prepared for labor and delivery.  Experiencing pain or cramping in the lower abdomen or lower back is a good sign that you should stop exercising. Check with your health care provider before continuing with normal exercises.  Try to avoid standing for long periods of time. Move your legs often if you must stand in one place for a long time.  Avoid heavy lifting.  Wear low-heeled shoes and practice good posture.  You may continue to have sex unless your health care provider tells you not to. Relieving pain and discomfort  Wear a good support bra to relieve breast tenderness.  Take warm sitz baths to soothe any pain or discomfort caused by hemorrhoids. Use hemorrhoid cream if your health care provider approves.  Rest with your legs elevated if you have leg cramps or low back pain.  If you develop   varicose veins in your legs, wear support hose. Elevate your feet for 15 minutes, 3-4 times a day. Limit salt in your diet. Prenatal care  Schedule your prenatal visits by the twelfth week of pregnancy. They are usually scheduled monthly at first, then more often in the last 2 months before delivery.  Write down your questions. Take them to your prenatal visits.  Keep all your prenatal visits as told by your health care provider. This is important. Safety  Wear your seat belt at all times when driving.  Make a list of emergency phone numbers, including numbers for family, friends, the hospital, and police and fire departments. General instructions  Ask your health care provider for a referral to a local prenatal education class. Begin classes no later than the beginning of month 6 of your pregnancy.  Ask for help if you have counseling or nutritional needs during pregnancy. Your health care provider can offer advice or refer you to specialists for help  with various needs.  Do not use hot tubs, steam rooms, or saunas.  Do not douche or use tampons or scented sanitary pads.  Do not cross your legs for long periods of time.  Avoid cat litter boxes and soil used by cats. These carry germs that can cause birth defects in the baby and possibly loss of the fetus by miscarriage or stillbirth.  Avoid all smoking, herbs, alcohol, and medicines not prescribed by your health care provider. Chemicals in these products affect the formation and growth of the baby.  Do not use any products that contain nicotine or tobacco, such as cigarettes and e-cigarettes. If you need help quitting, ask your health care provider. You may receive counseling support and other resources to help you quit.  Schedule a dentist appointment. At home, brush your teeth with a soft toothbrush and be gentle when you floss. Contact a health care provider if:  You have dizziness.  You have mild pelvic cramps, pelvic pressure, or nagging pain in the abdominal area.  You have persistent nausea, vomiting, or diarrhea.  You have a bad smelling vaginal discharge.  You have pain when you urinate.  You notice increased swelling in your face, hands, legs, or ankles.  You are exposed to fifth disease or chickenpox.  You are exposed to German measles (rubella) and have never had it. Get help right away if:  You have a fever.  You are leaking fluid from your vagina.  You have spotting or bleeding from your vagina.  You have severe abdominal cramping or pain.  You have rapid weight gain or loss.  You vomit blood or material that looks like coffee grounds.  You develop a severe headache.  You have shortness of breath.  You have any kind of trauma, such as from a fall or a car accident. Summary  The first trimester of pregnancy is from week 1 until the end of week 13 (months 1 through 3).  Your body goes through many changes during pregnancy. The changes vary from  woman to woman.  You will have routine prenatal visits. During those visits, your health care provider will examine you, discuss any test results you may have, and talk with you about how you are feeling. This information is not intended to replace advice given to you by your health care provider. Make sure you discuss any questions you have with your health care provider. Document Released: 09/17/2001 Document Revised: 09/04/2016 Document Reviewed: 09/04/2016 Elsevier Interactive Patient Education  2018 [employer]   Inc[employer]  

## 2018-08-19 ENCOUNTER — Ambulatory Visit (INDEPENDENT_AMBULATORY_CARE_PROVIDER_SITE_OTHER): Payer: BLUE CROSS/BLUE SHIELD

## 2018-08-19 DIAGNOSIS — O3680X0 Pregnancy with inconclusive fetal viability, not applicable or unspecified: Secondary | ICD-10-CM | POA: Diagnosis not present

## 2018-08-19 NOTE — Progress Notes (Signed)
US 8+2 wks,single IUP w/ys,positive fht 164 bpm,normal ovaries bilat,crl 16.10 mm

## 2018-08-27 ENCOUNTER — Encounter: Payer: Self-pay | Admitting: Family Medicine

## 2018-08-27 ENCOUNTER — Ambulatory Visit: Payer: BLUE CROSS/BLUE SHIELD | Admitting: Family Medicine

## 2018-08-27 VITALS — BP 112/70 | Temp 98.9°F | Wt 219.0 lb

## 2018-08-27 DIAGNOSIS — J069 Acute upper respiratory infection, unspecified: Secondary | ICD-10-CM

## 2018-08-27 NOTE — Progress Notes (Signed)
   Subjective:    Patient ID: Kathleen Patrick, female    DOB: 1990-10-23, 27 y.o.   MRN: [id number]  HPI  Patient is here today with complaints of sore throat,stuffy and runny nose,bilateral ear pressure, Migraine,facial pressure. This started yesterday. She has not taken any medication for this as she is [redacted] weeks pregnant and was unsure of what would be safe.   Reports started yesterday afternoon with runny nose, and head pressure/congestion, reports having migraine because of this. Throat starting to hurt d/t drainage. Fever last night of 102. Has not taken anything.    Review of Systems  Constitutional: Positive for fever.  HENT: Positive for congestion, ear pain, sinus pressure and sore throat.   Eyes: Negative for discharge.  Respiratory: Positive for cough. Negative for shortness of breath and wheezing.        Objective:   Physical Exam  Constitutional: She is oriented to person, place, and time. She appears well-developed and well-nourished. No distress.  HENT:  Head: Normocephalic and atraumatic.  Right Ear: Tympanic membrane normal.  Left Ear: Tympanic membrane normal.  Nose: Nose normal. No sinus tenderness.  Mouth/Throat: Uvula is midline, oropharynx is clear and moist and mucous membranes are normal.  Eyes: Right eye exhibits no discharge. Left eye exhibits no discharge.  Neck: Neck supple.  Cardiovascular: Normal rate, regular rhythm and normal heart sounds.  Pulmonary/Chest: Effort normal and breath sounds normal. No respiratory distress. She has no wheezes. She has no rales.  Lymphadenopathy:    She has no cervical adenopathy.  Neurological: She is alert and oriented to person, place, and time.  Skin: Skin is warm and dry.  Psychiatric: She has a normal mood and affect.  Nursing note and vitals reviewed.      Assessment & Plan:  Viral URI  Viral etiology likely at this time.  Recommend symptomatic care.  Discussed safe to take over-the-counter nasal  decongestant x3 days. May use saline nasal spray, humidifier, warm salt water gargles for comfort. Tylenol as needed for fever.  Warning signs discussed follow-up if symptoms worsen or fail to improve.  Dr. Lilyan PuntScott Luking was consulted on this case and is in agreement with the above treatment plan.

## 2018-08-27 NOTE — Patient Instructions (Addendum)
May take over the counter nasal decongestant spray like afrin (store brand), do not use for longer than 3 days. May use saline nasal spray, humidifier, warm salt water gargles as needed. May use tylenol for fever.

## 2018-09-02 ENCOUNTER — Ambulatory Visit: Payer: BLUE CROSS/BLUE SHIELD | Admitting: *Deleted

## 2018-09-02 ENCOUNTER — Ambulatory Visit (INDEPENDENT_AMBULATORY_CARE_PROVIDER_SITE_OTHER): Payer: BLUE CROSS/BLUE SHIELD | Admitting: Women's Health

## 2018-09-02 ENCOUNTER — Encounter: Payer: Self-pay | Admitting: Women's Health

## 2018-09-02 ENCOUNTER — Other Ambulatory Visit (HOSPITAL_COMMUNITY)
Admission: RE | Admit: 2018-09-02 | Discharge: 2018-09-02 | Disposition: A | Discharge status: Home / Self Care | Payer: BLUE CROSS/BLUE SHIELD | Source: Ambulatory Visit | Attending: Obstetrics & Gynecology | Admitting: Obstetrics & Gynecology

## 2018-09-02 VITALS — BP 132/80 | HR 97 | Wt 219.0 lb

## 2018-09-02 DIAGNOSIS — Z124 Encounter for screening for malignant neoplasm of cervix: Secondary | ICD-10-CM | POA: Insufficient documentation

## 2018-09-02 DIAGNOSIS — Z331 Pregnant state, incidental: Secondary | ICD-10-CM

## 2018-09-02 DIAGNOSIS — O09291 Supervision of pregnancy with other poor reproductive or obstetric history, first trimester: Secondary | ICD-10-CM

## 2018-09-02 DIAGNOSIS — O09299 Supervision of pregnancy with other poor reproductive or obstetric history, unspecified trimester: Secondary | ICD-10-CM | POA: Insufficient documentation

## 2018-09-02 DIAGNOSIS — Z3481 Encounter for supervision of other normal pregnancy, first trimester: Secondary | ICD-10-CM

## 2018-09-02 DIAGNOSIS — Z3682 Encounter for antenatal screening for nuchal translucency: Secondary | ICD-10-CM

## 2018-09-02 DIAGNOSIS — A599 Trichomoniasis, unspecified: Secondary | ICD-10-CM

## 2018-09-02 DIAGNOSIS — Z8751 Personal history of pre-term labor: Secondary | ICD-10-CM | POA: Insufficient documentation

## 2018-09-02 DIAGNOSIS — O09219 Supervision of pregnancy with history of pre-term labor, unspecified trimester: Secondary | ICD-10-CM

## 2018-09-02 DIAGNOSIS — O26891 Other specified pregnancy related conditions, first trimester: Secondary | ICD-10-CM | POA: Diagnosis not present

## 2018-09-02 DIAGNOSIS — O09211 Supervision of pregnancy with history of pre-term labor, first trimester: Secondary | ICD-10-CM

## 2018-09-02 DIAGNOSIS — N898 Other specified noninflammatory disorders of vagina: Secondary | ICD-10-CM

## 2018-09-02 DIAGNOSIS — Z1389 Encounter for screening for other disorder: Secondary | ICD-10-CM

## 2018-09-02 DIAGNOSIS — Z3A10 10 weeks gestation of pregnancy: Secondary | ICD-10-CM

## 2018-09-02 DIAGNOSIS — O09899 Supervision of other high risk pregnancies, unspecified trimester: Secondary | ICD-10-CM

## 2018-09-02 DIAGNOSIS — O0990 Supervision of high risk pregnancy, unspecified, unspecified trimester: Secondary | ICD-10-CM | POA: Insufficient documentation

## 2018-09-02 DIAGNOSIS — Z3490 Encounter for supervision of normal pregnancy, unspecified, unspecified trimester: Secondary | ICD-10-CM

## 2018-09-02 LAB — POCT WET PREP (WET MOUNT): CLUE CELLS WET PREP WHIFF POC: POSITIVE

## 2018-09-02 LAB — POCT URINALYSIS DIPSTICK OB
Blood, UA: NEGATIVE
GLUCOSE, UA: NEGATIVE
Ketones, UA: NEGATIVE
Leukocytes, UA: NEGATIVE
NITRITE UA: NEGATIVE

## 2018-09-02 MED ORDER — METRONIDAZOLE 500 MG PO TABS: 500.0000 mg | ORAL_TABLET | Freq: Two times a day (BID) | ORAL | 0 refills | Status: DC

## 2018-09-02 NOTE — Progress Notes (Signed)
INITIAL OBSTETRICAL VISIT Patient name: Kathleen Patrick MRN [id number]  Date of birth: 06-27-1991 Chief Complaint:   Initial Prenatal Visit  History of Present Illness:   Kathleen GemmaCatherine Wurtzel is a 27 y.o. [id number] Caucasian female at 7340w2d by LMP c/w 8wk u/s, with an Estimated Date of Delivery: 03/29/19 being seen today for her initial obstetrical visit.   Her obstetrical history is significant for .   Today she reports term SVB x 1- pre-e w/ 1st. [facility] [place]- was induced at 41wks for postdates, however baby weighed 6lb and had to be tx to [facility] x 1mth- they said baby was 35wks.  H/O dep- no meds, doing well  Patient's last menstrual period was 06/22/2018. Last pap 04/2015. Results were: normal Review of Systems:   Pertinent items are noted in HPI Denies cramping/contractions, leakage of fluid, vaginal bleeding, abnormal vaginal discharge w/ itching/odor/irritation, headaches, visual changes, shortness of breath, chest pain, abdominal pain, severe nausea/vomiting, or problems with urination or bowel movements unless otherwise stated above.  Pertinent History Reviewed:  Reviewed past medical,surgical, social, obstetrical and family history.  Reviewed problem list, medications and allergies. OB History  Gravida Para Term Preterm AB Living  4 3 2 1   3   SAB TAB Ectopic Multiple Live Births          3    # Outcome Date GA Lbr Len/2nd Weight Sex Delivery Anes PTL Lv  4 Current           3 Preterm 07/30/13 923w0d  6 lb (2.722 kg) M Vag-Spont EPI N LIV  2 Term 07/11/12 8968w1d 278:53 / 00:01 6 lb 11.2 oz (3.039 kg) M Vag-Spont None N LIV  1 Term 08/31/10 2370w0d  8 lb (3.629 kg) F Vag-Spont EPI N LIV     Complications: Preeclampsia   Physical Assessment:   Vitals:   09/02/18 0937  BP: 132/80  Pulse: 97  Weight: 219 lb (99.3 kg)  Body mass index is 37.59 kg/m.       Physical Examination:  General appearance - well appearing, and in no distress  Mental  status - alert, oriented to person, place, and time  Psych:  She has a normal mood and affect  Skin - warm and dry, normal color, no suspicious lesions noted  Chest - effort normal, all lung fields clear to auscultation bilaterally  Heart - normal rate and regular rhythm  Abdomen - soft, nontender  Extremities:  No swelling or varicosities noted  Pelvic - VULVA: normal appearing vulva with no masses, tenderness or lesions  VAGINA: normal appearing vagina with normal color and thin white frothy malodorous discharge, no lesions  CERVIX: normal appearing cervix without discharge or lesions, no CMT  Thin prep pap is done w/ reflex HR HPV cotesting  Fetal Heart Rate (bpm): +u/s via informal transabdominal u/s  Results for orders placed or performed in visit on 09/02/18 (from the past 24 hour(s))  POC Urinalysis Dipstick OB   Collection Time: 09/02/18  9:52 AM  Result Value Ref Range   Color, UA     Clarity, UA     Glucose, UA Negative Negative   Bilirubin, UA     Ketones, UA neg    Spec Grav, UA     Blood, UA neg    pH, UA     POC,PROTEIN,UA Trace Negative, Trace, Small (1+), Moderate (2+), Large (3+), 4+   Urobilinogen, UA     Nitrite, UA neg  Leukocytes, UA Negative Negative   Appearance     Odor    POCT Wet Prep [facility])   Collection Time: 09/02/18  1:27 PM  Result Value Ref Range   Source Wet Prep POC vaginal    WBC, Wet Prep HPF POC many    Bacteria Wet Prep HPF POC Few Few   BACTERIA WET PREP MORPHOLOGY POC     Clue Cells Wet Prep HPF POC None None   Clue Cells Wet Prep Whiff POC Positive Whiff    Yeast Wet Prep HPF POC None None   KOH Wet Prep POC     Trichomonas Wet Prep HPF POC Present (A) Absent    Assessment & Plan:  1) Low-Risk Pregnancy [id number] at [redacted]w[redacted]d with an Estimated Date of Delivery: 03/29/19   2) Initial OB visit  3) Trichomonas> rx metronidazole 500mg  BID x 7d, no sex x at least 7d from time both pt and partner are finished w/ meds, POC for pt in  3-4wks. Partner will get tx at [facility] (no insurance)  4) H/O Pre-e> ASA 162mg  at 12wks, baseline labs  5)   Meds:  Meds ordered this encounter  Medications  . DISCONTD: metroNIDAZOLE (FLAGYL) 500 MG tablet    Sig: Take 1 tablet (500 mg total) by mouth 2 (two) times daily.    Dispense:  14 tablet    Refill:  0    Order Specific Question:   Supervising Provider    Answer:   Despina Hidden, LUTHER H [[id number]]  . metroNIDAZOLE (FLAGYL) 500 MG tablet    Sig: Take 1 tablet (500 mg total) by mouth 2 (two) times daily.    Dispense:  14 tablet    Refill:  0    Order Specific Question:   Supervising Provider    Answer:   Lazaro Arms [[id number]]    Initial labs obtained Continue prenatal vitamins Reviewed n/v relief measures and warning s/s to report Reviewed recommended weight gain based on pre-gravid BMI Encouraged well-balanced diet Genetic Screening discussed First Screen: requested Cystic fibrosis screening discussed declined Ultrasound discussed; fetal survey: requested CCNC completed>states not applying for preg mcaid  Follow-up: Return in about 3 weeks (around 09/23/2018) for LROB, US:NT+1stIT, get pren[facility]als [facility], baby records [facility] score[facility]   Orders Placed This Encounter  Procedures  . Urine Culture  . US Fetal Nuchal Translucency Measurement  . Obstetric Panel, Including HIV  . Urinalysis, Routine w reflex microscopic  . Pain Management Screening Profile (10S)  . Comprehensive metabolic panel  . Protein / creatinine ratio, urine  . POC Urinalysis Dipstick OB  . POCT Wet Prep [facility] [place])    Cheral Marker CNM, [facility] 09/02/2018 1:31 PM

## 2018-09-02 NOTE — Patient Instructions (Addendum)
Kathleen Patrick, I greatly value your feedback.  If you receive a survey following your visit with us today, we appreciate you taking the time to fill it out.  Thanks, Joellyn HaffKim Cayle Thunder, CNM, WHNP-BC  Begin taking 162mg  (two 81mg  tablets) baby aspirin daily at 12 weeks of pregnancy (12/9) to decrease risk of preeclampsia during pregnancy     Nausea & Vomiting  Have saltine crackers or pretzels by your bed and eat a few bites before you raise your head out of bed in the morning  Eat small frequent meals throughout the day instead of large meals  Drink plenty of fluids throughout the day to stay hydrated, just don't drink a lot of fluids with your meals.  This can make your stomach fill up faster making you feel sick  Do not brush your teeth right after you eat  Products with real ginger are good for nausea, like ginger ale and ginger hard candy Make sure it says made with real ginger!  Sucking on sour candy like lemon heads is also good for nausea  If your prenatal vitamins make you nauseated, take them at night so you will sleep through the nausea  Sea Bands  If you feel like you need medicine for the nausea & vomiting please let us know  If you are unable to keep any fluids or food down please let us know   Constipation  Drink plenty of fluid, preferably water, throughout the day  Eat foods high in fiber such as fruits, vegetables, and grains  Exercise, such as walking, is a good way to keep your bowels regular  Drink warm fluids, especially warm prune juice, or decaf coffee  Eat a 1/2 cup of real oatmeal (not instant), 1/2 cup applesauce, and 1/2-1 cup warm prune juice every day  If needed, you may take Colace (docusate sodium) stool softener once or twice a day to help keep the stool soft. If you are pregnant, wait until you are out of your first trimester (12-14 weeks of pregnancy)  If you still are having problems with constipation, you may take Miralax once daily as needed  to help keep your bowels regular.  If you are pregnant, wait until you are out of your first trimester (12-14 weeks of pregnancy)   First Trimester of Pregnancy The first trimester of pregnancy is from week 1 until the end of week 12 (months 1 through 3). A week after a sperm fertilizes an egg, the egg will implant on the wall of the uterus. This embryo will begin to develop into a baby. Genes from you and your partner are forming the baby. The female genes determine whether the baby is a boy or a girl. At 6-8 weeks, the eyes and face are formed, and the heartbeat can be seen on ultrasound. At the end of 12 weeks, all the baby's organs are formed.  Now that you are pregnant, you will want to do everything you can to have a healthy baby. Two of the most important things are to get good prenatal care and to follow your health care provider's instructions. Prenatal care is all the medical care you receive before the baby's birth. This care will help prevent, find, and treat any problems during the pregnancy and childbirth. BODY CHANGES Your body goes through many changes during pregnancy. The changes vary from woman to woman.   You may gain or lose a couple of pounds at first.  You may feel sick to your stomach (nauseous)  and throw up (vomit). If the vomiting is uncontrollable, call your health care provider.  You may tire easily.  You may develop headaches that can be relieved by medicines approved by your health care provider.  You may urinate more often. Painful urination may mean you have a bladder infection.  You may develop heartburn as a result of your pregnancy.  You may develop constipation because certain hormones are causing the muscles that push waste through your intestines to slow down.  You may develop hemorrhoids or swollen, bulging veins (varicose veins).  Your breasts may begin to grow larger and become tender. Your nipples may stick out more, and the tissue that surrounds them  (areola) may become darker.  Your gums may bleed and may be sensitive to brushing and flossing.  Dark spots or blotches (chloasma, mask of pregnancy) may develop on your face. This will likely fade after the baby is born.  Your menstrual periods will stop.  You may have a loss of appetite.  You may develop cravings for certain kinds of food.  You may have changes in your emotions from day to day, such as being excited to be pregnant or being concerned that something may go wrong with the pregnancy and baby.  You may have more vivid and strange dreams.  You may have changes in your hair. These can include thickening of your hair, rapid growth, and changes in texture. Some women also have hair loss during or after pregnancy, or hair that feels dry or thin. Your hair will most likely return to normal after your baby is born. WHAT TO EXPECT AT YOUR PRENATAL VISITS During a routine prenatal visit:  You will be weighed to make sure you and the baby are growing normally.  Your blood pressure will be taken.  Your abdomen will be measured to track your baby's growth.  The fetal heartbeat will be listened to starting around week 10 or 12 of your pregnancy.  Test results from any previous visits will be discussed. Your health care provider may ask you:  How you are feeling.  If you are feeling the baby move.  If you have had any abnormal symptoms, such as leaking fluid, bleeding, severe headaches, or abdominal cramping.  If you have any questions. Other tests that may be performed during your first trimester include:  Blood tests to find your blood type and to check for the presence of any previous infections. They will also be used to check for low iron levels (anemia) and Rh antibodies. Later in the pregnancy, blood tests for diabetes will be done along with other tests if problems develop.  Urine tests to check for infections, diabetes, or protein in the urine.  An ultrasound to  confirm the proper growth and development of the baby.  An amniocentesis to check for possible genetic problems.  Fetal screens for spina bifida and Down syndrome.  You may need other tests to make sure you and the baby are doing well. HOME CARE INSTRUCTIONS  Medicines  Follow your health care provider's instructions regarding medicine use. Specific medicines may be either safe or unsafe to take during pregnancy.  Take your prenatal vitamins as directed.  If you develop constipation, try taking a stool softener if your health care provider approves. Diet  Eat regular, well-balanced meals. Choose a variety of foods, such as meat or vegetable-based protein, fish, milk and low-fat dairy products, vegetables, fruits, and whole grain breads and cereals. Your health care provider will help  you determine the amount of weight gain that is right for you.  Avoid raw meat and uncooked cheese. These carry germs that can cause birth defects in the baby.  Eating four or five small meals rather than three large meals a day may help relieve nausea and vomiting. If you start to feel nauseous, eating a few soda crackers can be helpful. Drinking liquids between meals instead of during meals also seems to help nausea and vomiting.  If you develop constipation, eat more high-fiber foods, such as fresh vegetables or fruit and whole grains. Drink enough fluids to keep your urine clear or pale yellow. Activity and Exercise  Exercise only as directed by your health care provider. Exercising will help you:  Control your weight.  Stay in shape.  Be prepared for labor and delivery.  Experiencing pain or cramping in the lower abdomen or low back is a good sign that you should stop exercising. Check with your health care provider before continuing normal exercises.  Try to avoid standing for long periods of time. Move your legs often if you must stand in one place for a long time.  Avoid heavy  lifting.  Wear low-heeled shoes, and practice good posture.  You may continue to have sex unless your health care provider directs you otherwise. Relief of Pain or Discomfort  Wear a good support bra for breast tenderness.   Take warm sitz baths to soothe any pain or discomfort caused by hemorrhoids. Use hemorrhoid cream if your health care provider approves.   Rest with your legs elevated if you have leg cramps or low back pain.  If you develop varicose veins in your legs, wear support hose. Elevate your feet for 15 minutes, 3-4 times a day. Limit salt in your diet. Prenatal Care  Schedule your prenatal visits by the twelfth week of pregnancy. They are usually scheduled monthly at first, then more often in the last 2 months before delivery.  Write down your questions. Take them to your prenatal visits.  Keep all your prenatal visits as directed by your health care provider. Safety  Wear your seat belt at all times when driving.  Make a list of emergency phone numbers, including numbers for family, friends, the hospital, and police and fire departments. General Tips  Ask your health care provider for a referral to a local prenatal education class. Begin classes no later than at the beginning of month 6 of your pregnancy.  Ask for help if you have counseling or nutritional needs during pregnancy. Your health care provider can offer advice or refer you to specialists for help with various needs.  Do not use hot tubs, steam rooms, or saunas.  Do not douche or use tampons or scented sanitary pads.  Do not cross your legs for long periods of time.  Avoid cat litter boxes and soil used by cats. These carry germs that can cause birth defects in the baby and possibly loss of the fetus by miscarriage or stillbirth.  Avoid all smoking, herbs, alcohol, and medicines not prescribed by your health care provider. Chemicals in these affect the formation and growth of the baby.  Schedule  a dentist appointment. At home, brush your teeth with a soft toothbrush and be gentle when you floss. SEEK MEDICAL CARE IF:   You have dizziness.  You have mild pelvic cramps, pelvic pressure, or nagging pain in the abdominal area.  You have persistent nausea, vomiting, or diarrhea.  You have a bad smelling vaginal  discharge.  You have pain with urination.  You notice increased swelling in your face, hands, legs, or ankles. SEEK IMMEDIATE MEDICAL CARE IF:   You have a fever.  You are leaking fluid from your vagina.  You have spotting or bleeding from your vagina.  You have severe abdominal cramping or pain.  You have rapid weight gain or loss.  You vomit blood or material that looks like coffee grounds.  You are exposed to [place] measles and have never had them.  You are exposed to fifth disease or chickenpox.  You develop a severe headache.  You have shortness of breath.  You have any kind of trauma, such as from a fall or a car accident. Document Released: 09/17/2001 Document Revised: 02/07/2014 Document Reviewed: 08/03/2013 [facility] Patient Information 2015 [place], [place]. This information is not intended to replace advice given to you by your health care provider. Make sure you discuss any questions you have with your health care provider.   Trichomoniasis Trichomoniasis is an STI (sexually transmitted infection) that can affect both women and men. In women, the outer area of the female genitalia (vulva) and the vagina are affected. In men, the penis is mainly affected, but the prostate and other reproductive organs can also be involved. This condition can be treated with medicine. It often has no symptoms (is asymptomatic), especially in men. What are the causes? This condition is caused by an organism called Trichomonas vaginalis. Trichomoniasis most often spreads from person to person (is contagious) through sexual contact. What increases the risk? The following  factors may make you more likely to develop this condition:  Having unprotected sexual intercourse.  Having sexual intercourse with a partner who has trichomoniasis.  Having multiple sexual partners.  Having had previous trichomoniasis infections or other STIs.  What are the signs or symptoms? In women, symptoms of trichomoniasis include:  Abnormal vaginal discharge that is clear, white, gray, or yellow-green and foamy and has an unusual "fishy" odor.  Itching and irritation of the vagina and vulva.  Burning or pain during urination or sexual intercourse.  Genital redness and swelling.  In men, symptoms of trichomoniasis include:  Penile discharge that may be foamy or contain pus.  Pain in the penis. This may happen only when urinating.  Itching or irritation inside the penis.  Burning after urination or ejaculation.  How is this diagnosed? In women, this condition may be found during a routine Pap test or physical exam. It may be found in men during a routine physical exam. Your health care provider may perform tests to help diagnose this infection, such as:  Urine tests (men and women).  The following in women: ? Testing the pH of the vagina. ? A vaginal swab test that checks for the Trichomonas vaginalis organism. ? Testing vaginal secretions.  Your health care provider may test you for other STIs, including HIV (human immunodeficiency virus). How is this treated? This condition is treated with medicine taken by mouth (orally), such as metronidazole or tinidazole to fight the infection. Your sexual partner(s) may also need to be tested and treated.  If you are a woman and you plan to become pregnant or think you may be pregnant, tell your health care provider right away. Some medicines that are used to treat the infection should not be taken during pregnancy.  Your health care provider may recommend over-the-counter medicines or creams to help relieve itching or  irritation. You may be tested for infection again 3 months after  treatment. Follow these instructions at home:  Take and use over-the-counter and prescription medicines, including creams, only as told by your health care provider.  Do not have sexual intercourse until one week after you finish your medicine, or until your health care provider approves. Ask your health care provider when you may resume sexual intercourse.  (Women) Do not douche or wear tampons while you have the infection.  Discuss your infection with your sexual partner(s). Make sure that your partner gets tested and treated, if necessary.  Keep all follow-up visits as told by your health care provider. This is important. How is this prevented?  Use condoms every time you have sex. Using condoms correctly and consistently can help protect against STIs.  Avoid having multiple sexual partners.  Talk with your sexual partner about any symptoms that either of you may have, as well as any history of STIs.  Get tested for STIs and STDs (sexually transmitted diseases) before you have sex. Ask your partner to do the same.  Do not have sexual contact if you have symptoms of trichomoniasis or another STI. Contact a health care provider if:  You still have symptoms after you finish your medicine.  You develop pain in your abdomen.  You have pain when you urinate.  You have bleeding after sexual intercourse.  You develop a rash.  You feel nauseous or you vomit.  You plan to become pregnant or think you may be pregnant. Summary  Trichomoniasis is an STI (sexually transmitted infection) that can affect both women and men.  This condition often has no symptoms (is asymptomatic), especially in men.  You should not have sexual intercourse until one week after you finish your medicine, or until your health care provider approves. Ask your health care provider when you may resume sexual intercourse.  Discuss your infection  with your sexual partner. Make sure that your partner gets tested and treated, if necessary. This information is not intended to replace advice given to you by your health care provider. Make sure you discuss any questions you have with your health care provider. Document Released: 03/19/2001 Document Revised: 08/16/2016 Document Reviewed: 08/16/2016 [employer] Interactive Patient Education  2017 [employer].

## 2018-09-03 LAB — MED LIST OPTION NOT SELECTED

## 2018-09-04 LAB — PMP SCREEN PROFILE (10S), URINE
Amphetamine Scrn, Ur: NEGATIVE ng/mL
BARBITURATE SCREEN URINE: NEGATIVE ng/mL
BENZODIAZEPINE SCREEN, URINE: NEGATIVE ng/mL
CANNABINOIDS UR QL SCN: NEGATIVE ng/mL
Cocaine (Metab) Scrn, Ur: NEGATIVE ng/mL
Creatinine(Crt), U: 139.7 mg/dL (ref 20.0–300.0)
Methadone Screen, Urine: NEGATIVE ng/mL
OXYCODONE+OXYMORPHONE UR QL SCN: NEGATIVE ng/mL
Opiate Scrn, Ur: NEGATIVE ng/mL
Ph of Urine: 6.1 (ref 4.5–8.9)
Phencyclidine Qn, Ur: NEGATIVE ng/mL
Propoxyphene Scrn, Ur: NEGATIVE ng/mL

## 2018-09-04 LAB — URINE CULTURE

## 2018-09-08 LAB — CYTOLOGY - PAP
Chlamydia: NEGATIVE
DIAGNOSIS: NEGATIVE
NEISSERIA GONORRHEA: NEGATIVE

## 2018-09-23 ENCOUNTER — Ambulatory Visit (INDEPENDENT_AMBULATORY_CARE_PROVIDER_SITE_OTHER): Payer: BLUE CROSS/BLUE SHIELD

## 2018-09-23 ENCOUNTER — Encounter: Payer: Self-pay | Admitting: Advanced Practice Midwife

## 2018-09-23 ENCOUNTER — Ambulatory Visit (INDEPENDENT_AMBULATORY_CARE_PROVIDER_SITE_OTHER): Payer: BLUE CROSS/BLUE SHIELD | Admitting: Advanced Practice Midwife

## 2018-09-23 ENCOUNTER — Other Ambulatory Visit: Payer: Self-pay

## 2018-09-23 VITALS — BP 130/84 | HR 93 | Wt 222.0 lb

## 2018-09-23 DIAGNOSIS — Z3682 Encounter for antenatal screening for nuchal translucency: Secondary | ICD-10-CM

## 2018-09-23 DIAGNOSIS — F32A Depression, unspecified: Secondary | ICD-10-CM

## 2018-09-23 DIAGNOSIS — Z3481 Encounter for supervision of other normal pregnancy, first trimester: Secondary | ICD-10-CM

## 2018-09-23 DIAGNOSIS — Z8619 Personal history of other infectious and parasitic diseases: Secondary | ICD-10-CM

## 2018-09-23 DIAGNOSIS — Z3A13 13 weeks gestation of pregnancy: Secondary | ICD-10-CM

## 2018-09-23 DIAGNOSIS — Z363 Encounter for antenatal screening for malformations: Secondary | ICD-10-CM

## 2018-09-23 DIAGNOSIS — F329 Major depressive disorder, single episode, unspecified: Secondary | ICD-10-CM

## 2018-09-23 DIAGNOSIS — Z1389 Encounter for screening for other disorder: Secondary | ICD-10-CM

## 2018-09-23 DIAGNOSIS — Z331 Pregnant state, incidental: Secondary | ICD-10-CM

## 2018-09-23 LAB — POCT URINALYSIS DIPSTICK OB
Blood, UA: NEGATIVE
Glucose, UA: NEGATIVE
Leukocytes, UA: NEGATIVE
Nitrite, UA: NEGATIVE
POC,PROTEIN,UA: NEGATIVE

## 2018-09-23 MED ORDER — DOXYLAMINE-PYRIDOXINE 10-10 MG PO TBEC: DELAYED_RELEASE_TABLET | ORAL | 6 refills | Status: DC

## 2018-09-23 MED ORDER — ESCITALOPRAM OXALATE 10 MG PO TABS: 10.0000 mg | ORAL_TABLET | Freq: Every day | ORAL | 6 refills | Status: DC

## 2018-09-23 MED ORDER — ASPIRIN EC 81 MG PO TBEC: 162.0000 mg | DELAYED_RELEASE_TABLET | Freq: Every day | ORAL | 6 refills | Status: DC

## 2018-09-23 NOTE — Patient Instructions (Signed)
Kathleen Patrick, I greatly value your feedback.  If you receive a survey following your visit with us today, we appreciate you taking the time to fill it out.  Thanks, Fran Cresenzo-Dishmon, CNM     Second Trimester of Pregnancy The second trimester is from week 14 through week 27 (months 4 through 6). The second trimester is often a time when you feel your best. Your body has adjusted to being pregnant, and you begin to feel better physically. Usually, morning sickness has lessened or quit completely, you may have more energy, and you may have an increase in appetite. The second trimester is also a time when the fetus is growing rapidly. At the end of the sixth month, the fetus is about 9 inches long and weighs about 1 pounds. You will likely begin to feel the baby move (quickening) between 16 and 20 weeks of pregnancy. Body changes during your second trimester Your body continues to go through many changes during your second trimester. The changes vary from woman to woman.  Your weight will continue to increase. You will notice your lower abdomen bulging out.  You may begin to get stretch marks on your hips, abdomen, and breasts.  You may develop headaches that can be relieved by medicines. The medicines should be approved by your health care provider.  You may urinate more often because the fetus is pressing on your bladder.  You may develop or continue to have heartburn as a result of your pregnancy.  You may develop constipation because certain hormones are causing the muscles that push waste through your intestines to slow down.  You may develop hemorrhoids or swollen, bulging veins (varicose veins).  You may have back pain. This is caused by: ? Weight gain. ? Pregnancy hormones that are relaxing the joints in your pelvis. ? A shift in weight and the muscles that support your balance.  Your breasts will continue to grow and they will continue to become tender.  Your gums  may bleed and may be sensitive to brushing and flossing.  Dark spots or blotches (chloasma, mask of pregnancy) may develop on your face. This will likely fade after the baby is born.  A dark line from your belly button to the pubic area (linea nigra) may appear. This will likely fade after the baby is born.  You may have changes in your hair. These can include thickening of your hair, rapid growth, and changes in texture. Some women also have hair loss during or after pregnancy, or hair that feels dry or thin. Your hair will most likely return to normal after your baby is born.  What to expect at prenatal visits During a routine prenatal visit:  You will be weighed to make sure you and the fetus are growing normally.  Your blood pressure will be taken.  Your abdomen will be measured to track your baby's growth.  The fetal heartbeat will be listened to.  Any test results from the previous visit will be discussed.  Your health care provider may ask you:  How you are feeling.  If you are feeling the baby move.  If you have had any abnormal symptoms, such as leaking fluid, bleeding, severe headaches, or abdominal cramping.  If you are using any tobacco products, including cigarettes, chewing tobacco, and electronic cigarettes.  If you have any questions.  Other tests that may be performed during your second trimester include:  Blood tests that check for: ? Low iron levels (anemia). ? High   blood sugar that affects pregnant women (gestational diabetes) between 42 and 28 weeks. ? Rh antibodies. This is to check for a protein on red blood cells (Rh factor).  Urine tests to check for infections, diabetes, or protein in the urine.  An ultrasound to confirm the proper growth and development of the baby.  An amniocentesis to check for possible genetic problems.  Fetal screens for spina bifida and Down syndrome.  HIV (human immunodeficiency virus) testing. Routine prenatal testing  includes screening for HIV, unless you choose not to have this test.  Follow these instructions at home: Medicines  Follow your health care provider's instructions regarding medicine use. Specific medicines may be either safe or unsafe to take during pregnancy.  Take a prenatal vitamin that contains at least 600 micrograms (mcg) of folic acid.  If you develop constipation, try taking a stool softener if your health care provider approves. Eating and drinking  Eat a balanced diet that includes fresh fruits and vegetables, whole grains, good sources of protein such as meat, eggs, or tofu, and low-fat dairy. Your health care provider will help you determine the amount of weight gain that is right for you.  Avoid raw meat and uncooked cheese. These carry germs that can cause birth defects in the baby.  If you have low calcium intake from food, talk to your health care provider about whether you should take a daily calcium supplement.  Limit foods that are high in fat and processed sugars, such as fried and sweet foods.  To prevent constipation: ? Drink enough fluid to keep your urine clear or pale yellow. ? Eat foods that are high in fiber, such as fresh fruits and vegetables, whole grains, and beans. Activity  Exercise only as directed by your health care provider. Most women can continue their usual exercise routine during pregnancy. Try to exercise for 30 minutes at least 5 days a week. Stop exercising if you experience uterine contractions.  Avoid heavy lifting, wear low heel shoes, and practice good posture.  A sexual relationship may be continued unless your health care provider directs you otherwise. Relieving pain and discomfort  Wear a good support bra to prevent discomfort from breast tenderness.  Take warm sitz baths to soothe any pain or discomfort caused by hemorrhoids. Use hemorrhoid cream if your health care provider approves.  Rest with your legs elevated if you have  leg cramps or low back pain.  If you develop varicose veins, wear support hose. Elevate your feet for 15 minutes, 3-4 times a day. Limit salt in your diet. Prenatal Care  Write down your questions. Take them to your prenatal visits.  Keep all your prenatal visits as told by your health care provider. This is important. Safety  Wear your seat belt at all times when driving.  Make a list of emergency phone numbers, including numbers for family, friends, the hospital, and police and fire departments. General instructions  Ask your health care provider for a referral to a local prenatal education class. Begin classes no later than the beginning of month 6 of your pregnancy.  Ask for help if you have counseling or nutritional needs during pregnancy. Your health care provider can offer advice or refer you to specialists for help with various needs.  Do not use hot tubs, steam rooms, or saunas.  Do not douche or use tampons or scented sanitary pads.  Do not cross your legs for long periods of time.  Avoid cat litter boxes and soil  used by cats. These carry germs that can cause birth defects in the baby and possibly loss of the fetus by miscarriage or stillbirth.  Avoid all smoking, herbs, alcohol, and unprescribed drugs. Chemicals in these products can affect the formation and growth of the baby.  Do not use any products that contain nicotine or tobacco, such as cigarettes and e-cigarettes. If you need help quitting, ask your health care provider.  Visit your dentist if you have not gone yet during your pregnancy. Use a soft toothbrush to brush your teeth and be gentle when you floss. Contact a health care provider if:  You have dizziness.  You have mild pelvic cramps, pelvic pressure, or nagging pain in the abdominal area.  You have persistent nausea, vomiting, or diarrhea.  You have a bad smelling vaginal discharge.  You have pain when you urinate. Get help right away if:  You  have a fever.  You are leaking fluid from your vagina.  You have spotting or bleeding from your vagina.  You have severe abdominal cramping or pain.  You have rapid weight gain or weight loss.  You have shortness of breath with chest pain.  You notice sudden or extreme swelling of your face, hands, ankles, feet, or legs.  You have not felt your baby move in over an hour.  You have severe headaches that do not go away when you take medicine.  You have vision changes. Summary  The second trimester is from week 14 through week 27 (months 4 through 6). It is also a time when the fetus is growing rapidly.  Your body goes through many changes during pregnancy. The changes vary from woman to woman.  Avoid all smoking, herbs, alcohol, and unprescribed drugs. These chemicals affect the formation and growth your baby.  Do not use any tobacco products, such as cigarettes, chewing tobacco, and e-cigarettes. If you need help quitting, ask your health care provider.  Contact your health care provider if you have any questions. Keep all prenatal visits as told by your health care provider. This is important. This information is not intended to replace advice given to you by your health care provider. Make sure you discuss any questions you have with your health care provider.      CHILDBIRTH CLASSES [phone] is the phone number for Pregnancy Classes or hospital tours at [facility] will be referred to  [email or web address] for more information on childbirth classes  At this site you may register for classes. You may sign up for a waiting list if classes are full. Please SIGN UP FOR THIS!.   When the waiting list becomes long, sometimes new classes can be added.

## 2018-09-23 NOTE — Progress Notes (Signed)
US 13+2 wks,measurements c/w dates,crl 70.39 mm,fhr 152 bpm,normal ovaries bilat,posterior placenta gr 0,NB present,NT 1.9 mm

## 2018-09-23 NOTE — Progress Notes (Addendum)
  [id number] Estimated Date of Delivery: 03/29/19  Blood pressure 130/84, pulse 93, weight 222 lb (100.7 kg), last menstrual period 06/22/2018.   BP weight and urine results all reviewed and noted.  Please refer to the obstetrical flow sheet for the fundal height and fetal heart rate documentation:  Patient denies any bleeding and no rupture of membranes symptoms or regular contractions. Patient wants to start meds for depression. Had done well with Lexapro All questions were answered.   Physical Assessment:   Vitals:   09/23/18 0904  BP: 130/84  Pulse: 93  Weight: 222 lb (100.7 kg)  Body mass index is 38.11 kg/m.        Physical Examination:   General appearance: Well appearing, and in no distress  Mental status: Alert, oriented to person, place, and time  Skin: Warm & dry  Cardiovascular: Normal heart rate noted  Respiratory: Normal respiratory effort, no distress  Abdomen: Soft, gravid, nontender  Pelvic: Cervical exam deferred         Extremities: Edema: None  Fetal Status:     Movement: Absent   US 13+2 wks,measurements c/w dates,crl 70.39 mm,fhr 152 bpm,normal ovaries bilat,posterior placenta gr 0,NB present,NT 1.9 mm  Results for orders placed or performed in visit on 09/23/18 (from the past 24 hour(s))  POC Urinalysis Dipstick OB   Collection Time: 09/23/18  9:11 AM  Result Value Ref Range   Color, UA     Clarity, UA     Glucose, UA Negative Negative   Bilirubin, UA     Ketones, UA small    Spec Grav, UA     Blood, UA neg    pH, UA     POC,PROTEIN,UA Negative Negative, Trace, Small (1+), Moderate (2+), Large (3+), 4+   Urobilinogen, UA     Nitrite, UA neg    Leukocytes, UA Negative Negative   Appearance     Odor       Orders Placed This Encounter  Procedures  . Trichomonas vaginalis, RNA  . US OB Comp + 14 Wk  . Integrated 1  . POC Urinalysis Dipstick OB    Plan:  Continued routine obstetrical care, rx diclegis, start ASA 162mg  (just started  flagyl, will do POC next time).   Return in about 5 weeks (around 10/28/2018) for LROB, AV:[id number]:Anatomy.

## 2018-09-23 NOTE — Addendum Note (Signed)
Addended by: Jacklyn ShellRESENZO-DISHMON, Cathern Tahir on: 09/23/2018 09:34 AM   Modules accepted: Orders

## 2018-09-24 LAB — COMPREHENSIVE METABOLIC PANEL
A/G RATIO: 1.4 (ref 1.2–2.2)
ALK PHOS: 71 IU/L (ref 39–117)
ALT: 10 IU/L (ref 0–32)
AST: 8 IU/L (ref 0–40)
Albumin: 3.9 g/dL (ref 3.5–5.5)
BILIRUBIN TOTAL: 0.3 mg/dL (ref 0.0–1.2)
BUN/Creatinine Ratio: 10 (ref 9–23)
BUN: 6 mg/dL (ref 6–20)
CHLORIDE: 105 mmol/L (ref 96–106)
CO2: 21 mmol/L (ref 20–29)
Calcium: 8.9 mg/dL (ref 8.7–10.2)
Creatinine, Ser: 0.58 mg/dL (ref 0.57–1.00)
GFR calc Af Amer: 146 mL/min/{1.73_m2} (ref >59)
GFR calc non Af Amer: 127 mL/min/{1.73_m2} (ref >59)
GLUCOSE: 102 mg/dL — AB (ref 65–99)
Globulin, Total: 2.8 g/dL (ref 1.5–4.5)
POTASSIUM: 3.5 mmol/L (ref 3.5–5.2)
Sodium: 140 mmol/L (ref 134–144)
Total Protein: 6.7 g/dL (ref 6.0–8.5)

## 2018-09-24 LAB — PROTEIN / CREATININE RATIO, URINE
Creatinine, Urine: 113.8 mg/dL
PROTEIN/CREAT RATIO: 100 mg/g{creat} (ref 0–200)
Protein, Ur: 11.4 mg/dL

## 2018-09-25 LAB — INTEGRATED 1
CROWN RUMP LENGTH MAT SCREEN: 70.4 mm
GEST. AGE ON COLLECTION DATE: 13 wk
Maternal Age at EDD: 27.8 yr
NUCHAL TRANSLUCENCY (NT): 1.9 mm
NUMBER OF FETUSES: 1
PAPP-A Value: 686.5 ng/mL
WEIGHT: 222 [lb_av]

## 2018-09-25 LAB — OBSTETRIC PANEL, INCLUDING HIV
Antibody Screen: NEGATIVE
BASOS: 0 %
Basophils Absolute: 0 10*3/uL (ref 0.0–0.2)
EOS (ABSOLUTE): 0.1 10*3/uL (ref 0.0–0.4)
EOS: 1 %
HEMATOCRIT: 39.8 % (ref 34.0–46.6)
HEMOGLOBIN: 13.6 g/dL (ref 11.1–15.9)
HIV SCREEN 4TH GENERATION: NONREACTIVE
Hepatitis B Surface Ag: NEGATIVE
IMMATURE GRANULOCYTES: 1 %
Immature Grans (Abs): 0 10*3/uL (ref 0.0–0.1)
LYMPHS: 23 %
Lymphocytes Absolute: 1.7 10*3/uL (ref 0.7–3.1)
MCH: 30.2 pg (ref 26.6–33.0)
MCHC: 34.2 g/dL (ref 31.5–35.7)
MCV: 88 fL (ref 79–97)
MONOS ABS: 0.3 10*3/uL (ref 0.1–0.9)
Monocytes: 5 %
Neutrophils Absolute: 5 10*3/uL (ref 1.4–7.0)
Neutrophils: 70 %
Platelets: 154 10*3/uL (ref 150–450)
RBC: 4.51 x10E6/uL (ref 3.77–5.28)
RDW: 12.5 % (ref 12.3–15.4)
RPR Ser Ql: NONREACTIVE
RUBELLA: 1 {index} (ref >0.99)
Rh Factor: POSITIVE
WBC: 7.1 10*3/uL (ref 3.4–10.8)

## 2018-09-25 LAB — URINALYSIS, ROUTINE W REFLEX MICROSCOPIC
Bilirubin, UA: NEGATIVE
GLUCOSE, UA: NEGATIVE
LEUKOCYTES UA: NEGATIVE
Nitrite, UA: NEGATIVE
PROTEIN UA: NEGATIVE
RBC UA: NEGATIVE
SPEC GRAV UA: 1.024 (ref 1.005–1.030)
UUROB: 0.2 mg/dL (ref 0.2–1.0)
pH, UA: 5.5 (ref 5.0–7.5)

## 2018-09-25 LAB — TRICHOMONAS VAGINALIS, PROBE AMP: Trich vag by NAA: POSITIVE — AB

## 2018-10-06 ENCOUNTER — Other Ambulatory Visit: Payer: Self-pay | Admitting: Advanced Practice Midwife

## 2018-10-06 MED ORDER — METRONIDAZOLE 500 MG PO TABS: 500.0000 mg | ORAL_TABLET | Freq: Two times a day (BID) | ORAL | 0 refills | Status: DC

## 2018-10-06 MED ORDER — ONDANSETRON 4 MG PO TBDP: 4.0000 mg | ORAL_TABLET | Freq: Four times a day (QID) | ORAL | 2 refills | Status: DC | PRN

## 2018-10-07 NOTE — L&D Delivery Note (Addendum)
Delivery Note Kathleen Patrick is a 28 y.o. [id number] at [redacted]w[redacted]d admitted for IOL for gHTN.  ROM: 3h 51m with clear fluid  Upon entering the room, the head was crowning. At 0014 a viable girl was delivered via spontaneous vaginal delivery by RN and SNM. Nuchal x1, baby was somersaulted to resolve. (Presentation: LOA ;vertex  ).  Infant placed directly on mom's abdomen for bonding/skin-to-skin. Delayed cord clamping x , then cord clamped x 2, and cut by father.  APGAR: 6, 8; weight: pending at time of note.  40 units of pitocin diluted in 1000cc LR was infused rapidly IV per protocol. The placenta separated spontaneously and delivered via CCT and maternal pushing effort. It was inspected and appears to be intact with a 3 VC.  Placenta/Cord with the following complications: none.  Intrapartum complications:  None Anesthesia:  epidural Episiotomy: none Lacerations:  none Suture Repair: n/a Est. Blood Loss (mL): 68 Sponge and instrument count were correct x2.  Mom to postpartum.  Baby to Couplet care / Skin to Skin. Placenta to L&D. Plans to formula feed Contraception: BTL Circ: n/a  Brand Males, SNM 03/12/2019 12:51 AM   The above was performed under my direct supervision and guidance.

## 2018-10-28 ENCOUNTER — Encounter: Payer: BLUE CROSS/BLUE SHIELD | Admitting: Women's Health

## 2018-10-28 ENCOUNTER — Other Ambulatory Visit: Payer: BLUE CROSS/BLUE SHIELD

## 2018-10-30 ENCOUNTER — Ambulatory Visit (INDEPENDENT_AMBULATORY_CARE_PROVIDER_SITE_OTHER): Payer: BLUE CROSS/BLUE SHIELD

## 2018-10-30 DIAGNOSIS — Z363 Encounter for antenatal screening for malformations: Secondary | ICD-10-CM

## 2018-10-30 DIAGNOSIS — Z3402 Encounter for supervision of normal first pregnancy, second trimester: Secondary | ICD-10-CM

## 2018-10-30 NOTE — Progress Notes (Signed)
[place] 18+4 wks,posterior placenta gr 0,svp of fluid 4.3 cm,normal ovaries bilat,cx 3.5 cm,fhr 137 bpm,efw 226 g 22%,limited view of heart because of pt body habitus,please have pt come back for additional images

## 2018-11-05 ENCOUNTER — Encounter: Payer: Self-pay | Admitting: Advanced Practice Midwife

## 2018-11-05 ENCOUNTER — Ambulatory Visit (INDEPENDENT_AMBULATORY_CARE_PROVIDER_SITE_OTHER): Payer: BLUE CROSS/BLUE SHIELD | Admitting: Advanced Practice Midwife

## 2018-11-05 VITALS — BP 120/79 | HR 95 | Wt 225.0 lb

## 2018-11-05 DIAGNOSIS — Z3482 Encounter for supervision of other normal pregnancy, second trimester: Secondary | ICD-10-CM

## 2018-11-05 DIAGNOSIS — IMO0002 Reserved for concepts with insufficient information to code with codable children: Secondary | ICD-10-CM

## 2018-11-05 DIAGNOSIS — Z331 Pregnant state, incidental: Secondary | ICD-10-CM

## 2018-11-05 DIAGNOSIS — Z0489 Encounter for examination and observation for other specified reasons: Secondary | ICD-10-CM

## 2018-11-05 DIAGNOSIS — Z3A19 19 weeks gestation of pregnancy: Secondary | ICD-10-CM

## 2018-11-05 DIAGNOSIS — A599 Trichomoniasis, unspecified: Secondary | ICD-10-CM

## 2018-11-05 DIAGNOSIS — Z1389 Encounter for screening for other disorder: Secondary | ICD-10-CM

## 2018-11-05 LAB — POCT URINALYSIS DIPSTICK OB
Glucose, UA: NEGATIVE
Leukocytes, UA: NEGATIVE
Nitrite, UA: NEGATIVE
POC,PROTEIN,UA: NEGATIVE
RBC UA: NEGATIVE

## 2018-11-05 NOTE — Patient Instructions (Signed)
Kathleen Patrick, I greatly value your feedback.  If you receive a survey following your visit with [place] today, we appreciate you taking the time to fill it out.  Thanks, Cathie Beams, CNM     Second Trimester of Pregnancy The second trimester is from week 14 through week 27 (months 4 through 6). The second trimester is often a time when you feel your best. Your body has adjusted to being pregnant, and you begin to feel better physically. Usually, morning sickness has lessened or quit completely, you may have more energy, and you may have an increase in appetite. The second trimester is also a time when the fetus is growing rapidly. At the end of the sixth month, the fetus is about 9 inches long and weighs about 1 pounds. You will likely begin to feel the baby move (quickening) between 16 and 20 weeks of pregnancy. Body changes during your second trimester Your body continues to go through many changes during your second trimester. The changes vary from woman to woman.  Your weight will continue to increase. You will notice your lower abdomen bulging out.  You may begin to get stretch marks on your hips, abdomen, and breasts.  You may develop headaches that can be relieved by medicines. The medicines should be approved by your health care provider.  You may urinate more often because the fetus is pressing on your bladder.  You may develop or continue to have heartburn as a result of your pregnancy.  You may develop constipation because certain hormones are causing the muscles that push waste through your intestines to slow down.  You may develop hemorrhoids or swollen, bulging veins (varicose veins).  You may have back pain. This is caused by: ? Weight gain. ? Pregnancy hormones that are relaxing the joints in your pelvis. ? A shift in weight and the muscles that support your balance.  Your breasts will continue to grow and they will continue to become tender.  Your gums  may bleed and may be sensitive to brushing and flossing.  Dark spots or blotches (chloasma, mask of pregnancy) may develop on your face. This will likely fade after the baby is born.  A dark line from your belly button to the pubic area (linea nigra) may appear. This will likely fade after the baby is born.  You may have changes in your hair. These can include thickening of your hair, rapid growth, and changes in texture. Some women also have hair loss during or after pregnancy, or hair that feels dry or thin. Your hair will most likely return to normal after your baby is born.  What to expect at prenatal visits During a routine prenatal visit:  You will be weighed to make sure you and the fetus are growing normally.  Your blood pressure will be taken.  Your abdomen will be measured to track your baby's growth.  The fetal heartbeat will be listened to.  Any test results from the previous visit will be discussed.  Your health care provider may ask you:  How you are feeling.  If you are feeling the baby move.  If you have had any abnormal symptoms, such as leaking fluid, bleeding, severe headaches, or abdominal cramping.  If you are using any tobacco products, including cigarettes, chewing tobacco, and electronic cigarettes.  If you have any questions.  Other tests that may be performed during your second trimester include:  Blood tests that check for: ? Low iron levels (anemia). ? High  blood sugar that affects pregnant women (gestational diabetes) between 42 and 28 weeks. ? Rh antibodies. This is to check for a protein on red blood cells (Rh factor).  Urine tests to check for infections, diabetes, or protein in the urine.  An ultrasound to confirm the proper growth and development of the baby.  An amniocentesis to check for possible genetic problems.  Fetal screens for spina bifida and Down syndrome.  HIV (human immunodeficiency virus) testing. Routine prenatal testing  includes screening for HIV, unless you choose not to have this test.  Follow these instructions at home: Medicines  Follow your health care provider's instructions regarding medicine use. Specific medicines may be either safe or unsafe to take during pregnancy.  Take a prenatal vitamin that contains at least 600 micrograms (mcg) of folic acid.  If you develop constipation, try taking a stool softener if your health care provider approves. Eating and drinking  Eat a balanced diet that includes fresh fruits and vegetables, whole grains, good sources of protein such as meat, eggs, or tofu, and low-fat dairy. Your health care provider will help you determine the amount of weight gain that is right for you.  Avoid raw meat and uncooked cheese. These carry germs that can cause birth defects in the baby.  If you have low calcium intake from food, talk to your health care provider about whether you should take a daily calcium supplement.  Limit foods that are high in fat and processed sugars, such as fried and sweet foods.  To prevent constipation: ? Drink enough fluid to keep your urine clear or pale yellow. ? Eat foods that are high in fiber, such as fresh fruits and vegetables, whole grains, and beans. Activity  Exercise only as directed by your health care provider. Most women can continue their usual exercise routine during pregnancy. Try to exercise for 30 minutes at least 5 days a week. Stop exercising if you experience uterine contractions.  Avoid heavy lifting, wear low heel shoes, and practice good posture.  A sexual relationship may be continued unless your health care provider directs you otherwise. Relieving pain and discomfort  Wear a good support bra to prevent discomfort from breast tenderness.  Take warm sitz baths to soothe any pain or discomfort caused by hemorrhoids. Use hemorrhoid cream if your health care provider approves.  Rest with your legs elevated if you have  leg cramps or low back pain.  If you develop varicose veins, wear support hose. Elevate your feet for 15 minutes, 3-4 times a day. Limit salt in your diet. Prenatal Care  Write down your questions. Take them to your prenatal visits.  Keep all your prenatal visits as told by your health care provider. This is important. Safety  Wear your seat belt at all times when driving.  Make a list of emergency phone numbers, including numbers for family, friends, the hospital, and police and fire departments. General instructions  Ask your health care provider for a referral to a local prenatal education class. Begin classes no later than the beginning of month 6 of your pregnancy.  Ask for help if you have counseling or nutritional needs during pregnancy. Your health care provider can offer advice or refer you to specialists for help with various needs.  Do not use hot tubs, steam rooms, or saunas.  Do not douche or use tampons or scented sanitary pads.  Do not cross your legs for long periods of time.  Avoid cat litter boxes and soil  used by cats. These carry germs that can cause birth defects in the baby and possibly loss of the fetus by miscarriage or stillbirth.  Avoid all smoking, herbs, alcohol, and unprescribed drugs. Chemicals in these products can affect the formation and growth of the baby.  Do not use any products that contain nicotine or tobacco, such as cigarettes and e-cigarettes. If you need help quitting, ask your health care provider.  Visit your dentist if you have not gone yet during your pregnancy. Use a soft toothbrush to brush your teeth and be gentle when you floss. Contact a health care provider if:  You have dizziness.  You have mild pelvic cramps, pelvic pressure, or nagging pain in the abdominal area.  You have persistent nausea, vomiting, or diarrhea.  You have a bad smelling vaginal discharge.  You have pain when you urinate. Get help right away if:  You  have a fever.  You are leaking fluid from your vagina.  You have spotting or bleeding from your vagina.  You have severe abdominal cramping or pain.  You have rapid weight gain or weight loss.  You have shortness of breath with chest pain.  You notice sudden or extreme swelling of your face, hands, ankles, feet, or legs.  You have not felt your baby move in over an hour.  You have severe headaches that do not go away when you take medicine.  You have vision changes. Summary  The second trimester is from week 14 through week 27 (months 4 through 6). It is also a time when the fetus is growing rapidly.  Your body goes through many changes during pregnancy. The changes vary from woman to woman.  Avoid all smoking, herbs, alcohol, and unprescribed drugs. These chemicals affect the formation and growth your baby.  Do not use any tobacco products, such as cigarettes, chewing tobacco, and e-cigarettes. If you need help quitting, ask your health care provider.  Contact your health care provider if you have any questions. Keep all prenatal visits as told by your health care provider. This is important. This information is not intended to replace advice given to you by your health care provider. Make sure you discuss any questions you have with your health care provider.      CHILDBIRTH CLASSES [phone] is the phone number for Pregnancy Classes or hospital tours at [facility] will be referred to  [email or web address] for more information on childbirth classes  At this site you may register for classes. You may sign up for a waiting list if classes are full. Please SIGN UP FOR THIS!.   When the waiting list becomes long, sometimes new classes can be added.

## 2018-11-05 NOTE — Progress Notes (Signed)
  [id number] [redacted]w[redacted]d Estimated Date of Delivery: 03/29/19  Blood pressure 120/79, pulse 95, weight 225 lb (102.1 kg), last menstrual period 06/22/2018.   BP weight and urine results all reviewed and noted.  Please refer to the obstetrical flow sheet for the fundal height and fetal heart rate documentation:  Patient reports good fetal movement, denies any bleeding and no rupture of membranes symptoms or regular contractions. Patient is without complaints. All questions were answered.   Physical Assessment:   Vitals:   11/05/18 0844  BP: 120/79  Pulse: 95  Weight: 225 lb (102.1 kg)  Body mass index is 38.62 kg/m.        Physical Examination:   General appearance: Well appearing, and in no distress  Mental status: Alert, oriented to person, place, and time  Skin: Warm & dry  Cardiovascular: Normal heart rate noted  Respiratory: Normal respiratory effort, no distress  Abdomen: Soft, gravid, nontender  Pelvic: Cervical exam deferred         Extremities: Edema: None  Fetal Status:     Movement: Present    Results for orders placed or performed in visit on 11/05/18 (from the past 24 hour(s))  POC Urinalysis Dipstick OB   Collection Time: 11/05/18  8:50 AM  Result Value Ref Range   Color, UA     Clarity, UA     Glucose, UA Negative Negative   Bilirubin, UA     Ketones, UA moderate    Spec Grav, UA     Blood, UA neg    pH, UA     POC,PROTEIN,UA Negative Negative, Trace, Small (1+), Moderate (2+), Large (3+), 4+   Urobilinogen, UA     Nitrite, UA neg    Leukocytes, UA Negative Negative   Appearance     Odor       Orders Placed This Encounter  Procedures  . Trichomonas vaginalis, RNA  . US OB Follow Up  . POC Urinalysis Dipstick OB    Plan:  Continued routine obstetrical care, POC trich  Return in about 3 weeks (around 11/26/2018) for LROB, US:OB F/U:.

## 2018-11-08 LAB — TRICHOMONAS VAGINALIS, PROBE AMP: Trich vag by NAA: NEGATIVE

## 2018-11-23 ENCOUNTER — Encounter: Payer: Self-pay | Admitting: Women's Health

## 2018-11-23 ENCOUNTER — Ambulatory Visit (INDEPENDENT_AMBULATORY_CARE_PROVIDER_SITE_OTHER): Payer: BLUE CROSS/BLUE SHIELD | Admitting: Women's Health

## 2018-11-23 ENCOUNTER — Ambulatory Visit (INDEPENDENT_AMBULATORY_CARE_PROVIDER_SITE_OTHER): Payer: BLUE CROSS/BLUE SHIELD

## 2018-11-23 VITALS — BP 115/80 | HR 83 | Wt 227.0 lb

## 2018-11-23 DIAGNOSIS — O09292 Supervision of pregnancy with other poor reproductive or obstetric history, second trimester: Secondary | ICD-10-CM

## 2018-11-23 DIAGNOSIS — Z3401 Encounter for supervision of normal first pregnancy, first trimester: Secondary | ICD-10-CM

## 2018-11-23 DIAGNOSIS — Z3A22 22 weeks gestation of pregnancy: Secondary | ICD-10-CM

## 2018-11-23 DIAGNOSIS — IMO0002 Reserved for concepts with insufficient information to code with codable children: Secondary | ICD-10-CM

## 2018-11-23 DIAGNOSIS — Z0489 Encounter for examination and observation for other specified reasons: Secondary | ICD-10-CM | POA: Diagnosis not present

## 2018-11-23 DIAGNOSIS — O09299 Supervision of pregnancy with other poor reproductive or obstetric history, unspecified trimester: Secondary | ICD-10-CM

## 2018-11-23 DIAGNOSIS — Z3482 Encounter for supervision of other normal pregnancy, second trimester: Secondary | ICD-10-CM

## 2018-11-23 DIAGNOSIS — Z1389 Encounter for screening for other disorder: Secondary | ICD-10-CM

## 2018-11-23 DIAGNOSIS — Z331 Pregnant state, incidental: Secondary | ICD-10-CM

## 2018-11-23 DIAGNOSIS — R040 Epistaxis: Secondary | ICD-10-CM

## 2018-11-23 LAB — POCT URINALYSIS DIPSTICK OB
Glucose, UA: NEGATIVE
Ketones, UA: NEGATIVE
Leukocytes, UA: NEGATIVE
Nitrite, UA: NEGATIVE
PROTEIN: NEGATIVE

## 2018-11-23 NOTE — Progress Notes (Signed)
[place] 22 wks,breech,posterior placenta gr 0,normal ovaries bilat,cx 4.6 cm,svp of fluid 5.8 cm,fhr 138 bpm,efw 435 g 24%,anatomy of the heart complete,no obvious abnormalities,limited view because of pt body habitus

## 2018-11-23 NOTE — Progress Notes (Signed)
LOW-RISK PREGNANCY VISIT Patient name: Kathleen Patrick MRN [id number]  Date of birth: 03/10/1991 Chief Complaint:   Routine Prenatal Visit ([place] today; migraines and nose bleeds)  History of Present Illness:   Kathleen Patrick is a 28 y.o. [phone] female at [redacted]w[redacted]d with an Estimated Date of Delivery: 03/29/19 being seen today for ongoing management of a low-risk pregnancy.  Today she reports frequent 'migraines'-frontal in location, goes to cold room to rest, doesn't take apap b/c doesn't help. Nose bleeds x 3 the other day, last one took a little longer to stop, none since. Did not have 2nd IT done, too late for AFP, declines NIPT. Contractions: Not present. Vag. Bleeding: None.  Movement: Present. denies leaking of fluid. Review of Systems:   Pertinent items are noted in HPI Denies abnormal vaginal discharge w/ itching/odor/irritation, headaches, visual changes, shortness of breath, chest pain, abdominal pain, severe nausea/vomiting, or problems with urination or bowel movements unless otherwise stated above. Pertinent History Reviewed:  Reviewed past medical,surgical, social, obstetrical and family history.  Reviewed problem list, medications and allergies. Physical Assessment:   Vitals:   11/23/18 0914  BP: 115/80  Pulse: 83  Weight: 227 lb (103 kg)  Body mass index is 38.96 kg/m.        Physical Examination:   General appearance: Well appearing, and in no distress  Mental status: Alert, oriented to person, place, and time  Skin: Warm & dry  Cardiovascular: Normal heart rate noted  Respiratory: Normal respiratory effort, no distress  Abdomen: Soft, gravid, nontender  Pelvic: Cervical exam deferred         Extremities: Edema: None  Fetal Status: Fetal Heart Rate (bpm): 138 u/s   Movement: Present    [place] 22 wks,breech,posterior placenta gr 0,normal ovaries bilat,cx 4.6 cm,svp of fluid 5.8 cm,fhr 138 bpm,efw 435 g 24%,anatomy of the heart complete,no obvious abnormalities,limited  view because of pt body habitus   Results for orders placed or performed in visit on 11/23/18 (from the past 24 hour(s))  POC Urinalysis Dipstick OB   Collection Time: 11/23/18  9:15 AM  Result Value Ref Range   Color, UA     Clarity, UA     Glucose, UA Negative Negative   Bilirubin, UA     Ketones, UA neg    Spec Grav, UA     Blood, UA trace    pH, UA     POC,PROTEIN,UA Negative Negative, Trace, Small (1+), Moderate (2+), Large (3+), 4+   Urobilinogen, UA     Nitrite, UA neg    Leukocytes, UA Negative Negative   Appearance     Odor      Assessment & Plan:  1) Low-risk pregnancy [id number] at [redacted]w[redacted]d with an Estimated Date of Delivery: 03/29/19   2) H/O pre-e, continue ASA  3) Headaches> gave printed prevention/relief measures   4) Nose bleeds> will check CBC, plt 154 on initial new ob labs   Meds: No orders of the defined types were placed in this encounter.  Labs/procedures today: f/u u/s, CBC  Plan:  Continue routine obstetrical care   Reviewed: Preterm labor symptoms and general obstetric precautions including but not limited to vaginal bleeding, contractions, leaking of fluid and fetal movement were reviewed in detail with the patient.  All questions were answered  Follow-up: Return in about 4 weeks (around 12/21/2018) for LROB, PN2.  Orders Placed This Encounter  Procedures  . CBC  . POC Urinalysis Dipstick OB   Cheral Marker CNM, [facility] 11/23/2018  9:48 AM

## 2018-11-23 NOTE — Patient Instructions (Signed)
Kathleen Patrick, I greatly value your feedback.  If you receive a survey following your visit with [place] today, we appreciate you taking the time to fill it out.  Thanks, [name], CNM, WHNP-BC   You will have your sugar test next visit.  Please do not eat or drink anything after midnight the night before you come, not even water.  You will be here for at least two hours.     Call the office [phone]) or go to [facility] if:  You begin to have strong, frequent contractions  Your water breaks.  Sometimes it is a big gush of fluid, sometimes it is just a trickle that keeps getting your panties wet or running down your legs  You have vaginal bleeding.  It is normal to have a small amount of spotting if your cervix was checked.   You don't feel your baby moving like normal.  If you don't, get you something to eat and drink and lay down and focus on feeling your baby move.   If your baby is still not moving like normal, you should call the office or go to [facility].  For Headaches:   Stay well hydrated, drink enough water so that your urine is clear, sometimes if you are dehydrated you can get headaches  Eat small frequent meals and snacks, sometimes if you are hungry you can get headaches  Sometimes you get headaches during pregnancy from the pregnancy hormones  You can try tylenol (1-2 regular strength 325mg  or 1-2 extra strength 500mg ) as directed on the box. The least amount of medication that works is best.   Cool compresses (cool wet washcloth or ice pack) to area of head that is hurting  You can also try drinking a caffeinated drink to see if this will help  If not helping, try below:  For Prevention of Headaches/Migraines:  CoQ10 100mg  three times daily  Vitamin B2 400mg  daily  Magnesium Oxide 400-600mg  daily  If You Get a Bad Headache/Migraine:  Benadryl 25mg    Magnesium Oxide  1 large Gatorade  2 extra strength Tylenol (1,000mg  total)  1 cup  coffee or Coke  If this doesn't help please call us @ [phone]    Second Trimester of Pregnancy The second trimester is from week 13 through week 28, months 4 through 6. The second trimester is often a time when you feel your best. Your body has also adjusted to being pregnant, and you begin to feel better physically. Usually, morning sickness has lessened or quit completely, you may have more energy, and you may have an increase in appetite. The second trimester is also a time when the fetus is growing rapidly. At the end of the sixth month, the fetus is about 9 inches long and weighs about 1 pounds. You will likely begin to feel the baby move (quickening) between 18 and 20 weeks of the pregnancy. BODY CHANGES Your body goes through many changes during pregnancy. The changes vary from woman to woman.   Your weight will continue to increase. You will notice your lower abdomen bulging out.  You may begin to get stretch marks on your hips, abdomen, and breasts.  You may develop headaches that can be relieved by medicines approved by your health care provider.  You may urinate more often because the fetus is pressing on your bladder.  You may develop or continue to have heartburn as a result of your pregnancy.  You may develop constipation because certain hormones  are causing the muscles that push waste through your intestines to slow down.  You may develop hemorrhoids or swollen, bulging veins (varicose veins).  You may have back pain because of the weight gain and pregnancy hormones relaxing your joints between the bones in your pelvis and as a result of a shift in weight and the muscles that support your balance.  Your breasts will continue to grow and be tender.  Your gums may bleed and may be sensitive to brushing and flossing.  Dark spots or blotches (chloasma, mask of pregnancy) may develop on your face. This will likely fade after the baby is born.  A dark line from your  belly button to the pubic area (linea nigra) may appear. This will likely fade after the baby is born.  You may have changes in your hair. These can include thickening of your hair, rapid growth, and changes in texture. Some women also have hair loss during or after pregnancy, or hair that feels dry or thin. Your hair will most likely return to normal after your baby is born. WHAT TO EXPECT AT YOUR PRENATAL VISITS During a routine prenatal visit:  You will be weighed to make sure you and the fetus are growing normally.  Your blood pressure will be taken.  Your abdomen will be measured to track your baby's growth.  The fetal heartbeat will be listened to.  Any test results from the previous visit will be discussed. Your health care provider may ask you:  How you are feeling.  If you are feeling the baby move.  If you have had any abnormal symptoms, such as leaking fluid, bleeding, severe headaches, or abdominal cramping.  If you have any questions. Other tests that may be performed during your second trimester include:  Blood tests that check for:  Low iron levels (anemia).  Gestational diabetes (between 24 and 28 weeks).  Rh antibodies.  Urine tests to check for infections, diabetes, or protein in the urine.  An ultrasound to confirm the proper growth and development of the baby.  An amniocentesis to check for possible genetic problems.  Fetal screens for spina bifida and Down syndrome. HOME CARE INSTRUCTIONS   Avoid all smoking, herbs, alcohol, and unprescribed drugs. These chemicals affect the formation and growth of the baby.  Follow your health care provider's instructions regarding medicine use. There are medicines that are either safe or unsafe to take during pregnancy.  Exercise only as directed by your health care provider. Experiencing uterine cramps is a good sign to stop exercising.  Continue to eat regular, healthy meals.  Wear a good support bra for  breast tenderness.  Do not use hot tubs, steam rooms, or saunas.  Wear your seat belt at all times when driving.  Avoid raw meat, uncooked cheese, cat litter boxes, and soil used by cats. These carry germs that can cause birth defects in the baby.  Take your prenatal vitamins.  Try taking a stool softener (if your health care provider approves) if you develop constipation. Eat more high-fiber foods, such as fresh vegetables or fruit and whole grains. Drink plenty of fluids to keep your urine clear or pale yellow.  Take warm sitz baths to soothe any pain or discomfort caused by hemorrhoids. Use hemorrhoid cream if your health care provider approves.  If you develop varicose veins, wear support hose. Elevate your feet for 15 minutes, 3-4 times a day. Limit salt in your diet.  Avoid heavy lifting, wear low heel shoes,  and practice good posture.  Rest with your legs elevated if you have leg cramps or low back pain.  Visit your dentist if you have not gone yet during your pregnancy. Use a soft toothbrush to brush your teeth and be gentle when you floss.  A sexual relationship may be continued unless your health care provider directs you otherwise.  Continue to go to all your prenatal visits as directed by your health care provider. SEEK MEDICAL CARE IF:   You have dizziness.  You have mild pelvic cramps, pelvic pressure, or nagging pain in the abdominal area.  You have persistent nausea, vomiting, or diarrhea.  You have a bad smelling vaginal discharge.  You have pain with urination. SEEK IMMEDIATE MEDICAL CARE IF:   You have a fever.  You are leaking fluid from your vagina.  You have spotting or bleeding from your vagina.  You have severe abdominal cramping or pain.  You have rapid weight gain or loss.  You have shortness of breath with chest pain.  You notice sudden or extreme swelling of your face, hands, ankles, feet, or legs.  You have not felt your baby move in  over an hour.  You have severe headaches that do not go away with medicine.  You have vision changes. Document Released: 09/17/2001 Document Revised: 09/28/2013 Document Reviewed: 11/24/2012 [facility] Patient Information 2015 [place], [place]. This information is not intended to replace advice given to you by your health care provider. Make sure you discuss any questions you have with your health care provider.

## 2018-11-24 LAB — CBC
Hematocrit: 40.6 % (ref 34.0–46.6)
Hemoglobin: 13.3 g/dL (ref 11.1–15.9)
MCH: 29.8 pg (ref 26.6–33.0)
MCHC: 32.8 g/dL (ref 31.5–35.7)
MCV: 91 fL (ref 79–97)
Platelets: 135 10*3/uL — ABNORMAL LOW (ref 150–450)
RBC: 4.47 x10E6/uL (ref 3.77–5.28)
RDW: 12.2 % (ref 11.7–15.4)
WBC: 7.4 10*3/uL (ref 3.4–10.8)

## 2018-12-21 ENCOUNTER — Ambulatory Visit (INDEPENDENT_AMBULATORY_CARE_PROVIDER_SITE_OTHER): Payer: BLUE CROSS/BLUE SHIELD | Admitting: Family Medicine

## 2018-12-21 ENCOUNTER — Other Ambulatory Visit: Payer: Self-pay

## 2018-12-21 ENCOUNTER — Other Ambulatory Visit: Payer: BLUE CROSS/BLUE SHIELD

## 2018-12-21 VITALS — BP 125/82 | HR 96 | Wt 227.6 lb

## 2018-12-21 DIAGNOSIS — Z3482 Encounter for supervision of other normal pregnancy, second trimester: Secondary | ICD-10-CM

## 2018-12-21 DIAGNOSIS — O09219 Supervision of pregnancy with history of pre-term labor, unspecified trimester: Secondary | ICD-10-CM

## 2018-12-21 DIAGNOSIS — Z3A26 26 weeks gestation of pregnancy: Secondary | ICD-10-CM

## 2018-12-21 DIAGNOSIS — O09212 Supervision of pregnancy with history of pre-term labor, second trimester: Secondary | ICD-10-CM

## 2018-12-21 DIAGNOSIS — Z331 Pregnant state, incidental: Secondary | ICD-10-CM

## 2018-12-21 DIAGNOSIS — O09299 Supervision of pregnancy with other poor reproductive or obstetric history, unspecified trimester: Secondary | ICD-10-CM

## 2018-12-21 DIAGNOSIS — O09899 Supervision of other high risk pregnancies, unspecified trimester: Secondary | ICD-10-CM

## 2018-12-21 DIAGNOSIS — O09292 Supervision of pregnancy with other poor reproductive or obstetric history, second trimester: Secondary | ICD-10-CM

## 2018-12-21 LAB — POCT URINALYSIS DIPSTICK OB
Blood, UA: NEGATIVE
Glucose, UA: NEGATIVE
Ketones, UA: NEGATIVE
Leukocytes, UA: NEGATIVE
Nitrite, UA: NEGATIVE
POC,PROTEIN,UA: NEGATIVE

## 2018-12-21 NOTE — Progress Notes (Signed)
    PRENATAL VISIT NOTE  Subjective:  Kathleen Patrick is a 28 y.o. [phone] at [redacted]w[redacted]d being seen today for ongoing prenatal care.  She is currently monitored for the following issues for this high-risk pregnancy and has Depression; Supervision of normal pregnancy; Hx of preeclampsia, prior pregnancy, currently pregnant; History of preterm delivery, currently pregnant; and Trichomonas infection on their problem list.  Patient reports no complaints.  Contractions: Not present. Vag. Bleeding: None.  Movement: Present. Denies leaking of fluid.   The following portions of the patient's history were reviewed and updated as appropriate: allergies, current medications, past family history, past medical history, past social history, past surgical history and problem list. Problem list updated.  Objective:   Vitals:   12/21/18 0917  BP: 125/82  Pulse: 96  Weight: 227 lb 9.6 oz (103.2 kg)    Fetal Status: Fetal Heart Rate (bpm): 145 Fundal Height: 27 cm Movement: Present     General:  Alert, oriented and cooperative. Patient is in no acute distress.  Skin: Skin is warm and dry. No rash noted.   Cardiovascular: Normal heart rate noted  Respiratory: Normal respiratory effort, no problems with respiration noted  Abdomen: Soft, gravid, appropriate for gestational age.  Pain/Pressure: Absent     Pelvic: Cervical exam deferred        Extremities: Normal range of motion.  Edema: None  Mental Status: Normal mood and affect. Normal behavior. Normal judgment and thought content.   Assessment and Plan:  Pregnancy: [id number] at [redacted]w[redacted]d  1. Pregnant state, incidental - POC Urinalysis Dipstick OB  2. Encounter for supervision of other normal pregnancy in second trimester - Up to date, gettign 28wk labs today - POC Urinalysis Dipstick OB  3. Hx of preeclampsia, prior pregnancy, currently pregnant Taking ASA  4. History of preterm delivery, currently pregnant Iatrogenic cause-- IOL for postdates but  was 35 wks by Marissa Calamity.    Preterm labor symptoms and general obstetric precautions including but not limited to vaginal bleeding, contractions, leaking of fluid and fetal movement were reviewed in detail with the patient. Please refer to After Visit Summary for other counseling recommendations.  No follow-ups on file.  Future Appointments  Date Time Provider Department Center  01/19/2019  8:30 AM Cheral Marker, CNM [facility]    Federico Flake, MD

## 2018-12-22 LAB — CBC
Hematocrit: 36.6 % (ref 34.0–46.6)
Hemoglobin: 12.6 g/dL (ref 11.1–15.9)
MCH: 29.8 pg (ref 26.6–33.0)
MCHC: 34.4 g/dL (ref 31.5–35.7)
MCV: 87 fL (ref 79–97)
Platelets: 118 10*3/uL — ABNORMAL LOW (ref 150–450)
RBC: 4.23 x10E6/uL (ref 3.77–5.28)
RDW: 11.9 % (ref 11.7–15.4)
WBC: 9.2 10*3/uL (ref 3.4–10.8)

## 2018-12-22 LAB — GLUCOSE TOLERANCE, 2 HOURS W/ 1HR
GLUCOSE, 1 HOUR: 121 mg/dL (ref 65–179)
Glucose, 2 hour: 96 mg/dL (ref 65–152)
Glucose, Fasting: 80 mg/dL (ref 65–91)

## 2018-12-22 LAB — HIV ANTIBODY (ROUTINE TESTING W REFLEX): HIV Screen 4th Generation wRfx: NONREACTIVE

## 2018-12-22 LAB — RPR: RPR Ser Ql: NONREACTIVE

## 2018-12-22 LAB — ANTIBODY SCREEN: ANTIBODY SCREEN: NEGATIVE

## 2018-12-28 ENCOUNTER — Encounter: Payer: Self-pay | Admitting: Women's Health

## 2018-12-28 DIAGNOSIS — D696 Thrombocytopenia, unspecified: Secondary | ICD-10-CM | POA: Insufficient documentation

## 2018-12-28 DIAGNOSIS — O99119 Other diseases of the blood and blood-forming organs and certain disorders involving the immune mechanism complicating pregnancy, unspecified trimester: Secondary | ICD-10-CM

## 2019-01-18 ENCOUNTER — Telehealth: Payer: Self-pay | Admitting: *Deleted

## 2019-01-18 NOTE — Telephone Encounter (Signed)
Patient informed that we are not allowing visitors or children to come to appointments at this time. Patient denies any contact with anyone suspected or confirmed of having COVID-19. Pt denies fever, cough, sob, muscle pain, diarrhea, rash, vomiting, abdominal pain, red eye, weakness, bruising or bleeding, joint pain or severe headache.  

## 2019-01-19 ENCOUNTER — Encounter: Payer: BLUE CROSS/BLUE SHIELD | Admitting: Women's Health

## 2019-01-19 ENCOUNTER — Other Ambulatory Visit: Payer: Self-pay

## 2019-01-19 ENCOUNTER — Encounter: Payer: Self-pay | Admitting: Obstetrics & Gynecology

## 2019-01-19 ENCOUNTER — Ambulatory Visit (INDEPENDENT_AMBULATORY_CARE_PROVIDER_SITE_OTHER): Payer: Medicaid Other | Admitting: Obstetrics & Gynecology

## 2019-01-19 VITALS — BP 128/82 | HR 97 | Temp 97.0°F | Wt 231.6 lb

## 2019-01-19 DIAGNOSIS — Z3483 Encounter for supervision of other normal pregnancy, third trimester: Secondary | ICD-10-CM

## 2019-01-19 DIAGNOSIS — O99113 Other diseases of the blood and blood-forming organs and certain disorders involving the immune mechanism complicating pregnancy, third trimester: Secondary | ICD-10-CM

## 2019-01-19 DIAGNOSIS — Z331 Pregnant state, incidental: Secondary | ICD-10-CM

## 2019-01-19 DIAGNOSIS — Z3A30 30 weeks gestation of pregnancy: Secondary | ICD-10-CM

## 2019-01-19 DIAGNOSIS — D696 Thrombocytopenia, unspecified: Secondary | ICD-10-CM

## 2019-01-19 DIAGNOSIS — Z1389 Encounter for screening for other disorder: Secondary | ICD-10-CM

## 2019-01-19 LAB — POCT URINALYSIS DIPSTICK OB
Blood, UA: NEGATIVE
Glucose, UA: NEGATIVE
Ketones, UA: NEGATIVE
Leukocytes, UA: NEGATIVE
Nitrite, UA: NEGATIVE

## 2019-01-19 NOTE — Progress Notes (Signed)
   LOW-RISK PREGNANCY VISIT Patient name: Kathleen Patrick MRN [id number]  Date of birth: [birth year] Chief Complaint:   Routine Prenatal Visit (repeat cbc)  History of Present Illness:   Kathleen Patrick is a 28 y.o. [phone] female at [redacted]w[redacted]d with an Estimated Date of Delivery: 03/29/19 being seen today for ongoing management of a low-risk pregnancy.  Today she reports no complaints. Contractions: Not present.  .  Movement: Present. denies leaking of fluid. Review of Systems:   Pertinent items are noted in HPI Denies abnormal vaginal discharge w/ itching/odor/irritation, headaches, visual changes, shortness of breath, chest pain, abdominal pain, severe nausea/vomiting, or problems with urination or bowel movements unless otherwise stated above. Pertinent History Reviewed:  Reviewed past medical,surgical, social, obstetrical and family history.  Reviewed problem list, medications and allergies. Physical Assessment:   Vitals:   01/19/19 1050  BP: 128/82  Pulse: 97  Temp: (!) 97 F (36.1 C)  Weight: 231 lb 9.6 oz (105.1 kg)  Body mass index is 39.75 kg/m.        Physical Examination:   General appearance: Well appearing, and in no distress  Mental status: Alert, oriented to person, place, and time  Skin: Warm & dry  Cardiovascular: Normal heart rate noted  Respiratory: Normal respiratory effort, no distress  Abdomen: Soft, gravid, nontender  Pelvic: Cervical exam deferred         Extremities: Edema: None  Fetal Status: Fetal Heart Rate (bpm): 150 Fundal Height: 33 cm Movement: Present    Results for orders placed or performed in visit on 01/19/19 (from the past 24 hour(s))  POC Urinalysis Dipstick OB   Collection Time: 01/19/19 10:51 AM  Result Value Ref Range   Color, UA     Clarity, UA     Glucose, UA Negative Negative   Bilirubin, UA     Ketones, UA neg    Spec Grav, UA     Blood, UA neg    pH, UA     POC,PROTEIN,UA Trace Negative, Trace, Small (1+), Moderate (2+),  Large (3+), 4+   Urobilinogen, UA     Nitrite, UA neg    Leukocytes, UA Negative Negative   Appearance     Odor      Assessment & Plan:  1) Low-risk pregnancy [id number] at [redacted]w[redacted]d with an Estimated Date of Delivery: 03/29/19   2) Gestational thrombocytopenia, recheck today   Meds: No orders of the defined types were placed in this encounter.  Labs/procedures today: CBC  Plan:  Continue routine obstetrical care   Reviewed: Preterm labor symptoms and general obstetric precautions including but not limited to vaginal bleeding, contractions, leaking of fluid and fetal movement were reviewed in detail with the patient.  All questions were answered  Follow-up: Return in about 3 weeks (around 02/09/2019) for LROB(no access to a BP cuff).  Orders Placed This Encounter  Procedures  . CBC  . POC Urinalysis Dipstick OB   Kathleen Patrick  01/19/2019 11:11 AM

## 2019-01-20 LAB — CBC
Hematocrit: 37.4 % (ref 34.0–46.6)
Hemoglobin: 12.3 g/dL (ref 11.1–15.9)
MCH: 28.7 pg (ref 26.6–33.0)
MCHC: 32.9 g/dL (ref 31.5–35.7)
MCV: 87 fL (ref 79–97)
Platelets: 129 10*3/uL — ABNORMAL LOW (ref 150–450)
RBC: 4.29 x10E6/uL (ref 3.77–5.28)
RDW: 12.2 % (ref 11.7–15.4)
WBC: 8.4 10*3/uL (ref 3.4–10.8)

## 2019-02-08 ENCOUNTER — Encounter: Payer: Self-pay | Admitting: *Deleted

## 2019-02-09 ENCOUNTER — Other Ambulatory Visit: Payer: Self-pay

## 2019-02-09 ENCOUNTER — Ambulatory Visit (INDEPENDENT_AMBULATORY_CARE_PROVIDER_SITE_OTHER): Payer: Medicaid Other | Admitting: Women's Health

## 2019-02-09 ENCOUNTER — Encounter: Payer: Self-pay | Admitting: Women's Health

## 2019-02-09 VITALS — BP 135/88 | HR 98 | Wt 238.8 lb

## 2019-02-09 DIAGNOSIS — D696 Thrombocytopenia, unspecified: Secondary | ICD-10-CM | POA: Diagnosis not present

## 2019-02-09 DIAGNOSIS — O99113 Other diseases of the blood and blood-forming organs and certain disorders involving the immune mechanism complicating pregnancy, third trimester: Secondary | ICD-10-CM | POA: Diagnosis not present

## 2019-02-09 DIAGNOSIS — Z1389 Encounter for screening for other disorder: Secondary | ICD-10-CM

## 2019-02-09 DIAGNOSIS — Z3A33 33 weeks gestation of pregnancy: Secondary | ICD-10-CM | POA: Diagnosis not present

## 2019-02-09 DIAGNOSIS — Z3483 Encounter for supervision of other normal pregnancy, third trimester: Secondary | ICD-10-CM

## 2019-02-09 DIAGNOSIS — Z331 Pregnant state, incidental: Secondary | ICD-10-CM

## 2019-02-09 LAB — POCT URINALYSIS DIPSTICK OB
Blood, UA: NEGATIVE
Leukocytes, UA: NEGATIVE
Nitrite, UA: NEGATIVE
POC,PROTEIN,UA: NEGATIVE

## 2019-02-09 LAB — POCT GLUCOSE (DEVICE FOR HOME USE): POC Glucose: 199 mg/dl — AB (ref 70–99)

## 2019-02-09 MED ORDER — BLOOD PRESSURE MONITOR MISC: 0 refills | Status: DC

## 2019-02-09 NOTE — Patient Instructions (Signed)
Kathleen Patrick, I greatly value your feedback.  If you receive a survey following your visit with [place] today, we appreciate you taking the time to fill it out.  Thanks, Kathleen Patrick, CNM, [facility]  [facility] HAS MOVED!!! It is now [facility] at [facility] ([address] [place], [place] [address]) Entrance located off of [address] Free 24/7 valet parking    Call the office [phone]) or go to [facility] if:  You begin to have strong, frequent contractions  Your water breaks.  Sometimes it is a big gush of fluid, sometimes it is just a trickle that keeps getting your panties wet or running down your legs  You have vaginal bleeding.  It is normal to have a small amount of spotting if your cervix was checked.   You don't feel your baby moving like normal.  If you don't, get you something to eat and drink and lay down and focus on feeling your baby move.  You should feel at least 10 movements in 2 hours.  If you don't, you should call the office or go to [facility].    Tdap Vaccine  It is recommended that you get the Tdap vaccine during the third trimester of EACH pregnancy to help protect your baby from getting pertussis (whooping cough)  27-36 weeks is the BEST time to do this so that you can pass the protection on to your baby. During pregnancy is better than after pregnancy, but if you are unable to get it during pregnancy it will be offered at the hospital.   You can get this vaccine with [place], at the health department, your family doctor, or some local pharmacies  Everyone who will be around your baby should also be up-to-date on their vaccines before the baby comes. Adults (who are not pregnant) only need 1 dose of Tdap during adulthood.   Third Trimester of Pregnancy The third trimester is from week 29 through week 42, months 7 through 9. The third trimester is a time when the fetus is growing rapidly. At the end of the ninth month, the fetus is  about 20 inches in length and weighs 6-10 pounds.  BODY CHANGES Your body goes through many changes during pregnancy. The changes vary from woman to woman.   Your weight will continue to increase. You can expect to gain 25-35 pounds (11-16 kg) by the end of the pregnancy.  You may begin to get stretch marks on your hips, abdomen, and breasts.  You may urinate more often because the fetus is moving lower into your pelvis and pressing on your bladder.  You may develop or continue to have heartburn as a result of your pregnancy.  You may develop constipation because certain hormones are causing the muscles that push waste through your intestines to slow down.  You may develop hemorrhoids or swollen, bulging veins (varicose veins).  You may have pelvic pain because of the weight gain and pregnancy hormones relaxing your joints between the bones in your pelvis. Backaches may result from overexertion of the muscles supporting your posture.  You may have changes in your hair. These can include thickening of your hair, rapid growth, and changes in texture. Some women also have hair loss during or after pregnancy, or hair that feels dry or thin. Your hair will most likely return to normal after your baby is born.  Your breasts will continue to grow and be tender. A yellow discharge may leak from  your breasts called colostrum.  Your belly button may stick out.  You may feel short of breath because of your expanding uterus.  You may notice the fetus "dropping," or moving lower in your abdomen.  You may have a bloody mucus discharge. This usually occurs a few days to a week before labor begins.  Your cervix becomes thin and soft (effaced) near your due date. WHAT TO EXPECT AT YOUR PRENATAL EXAMS  You will have prenatal exams every 2 weeks until week 36. Then, you will have weekly prenatal exams. During a routine prenatal visit:  You will be weighed to make sure you and the fetus are growing  normally.  Your blood pressure is taken.  Your abdomen will be measured to track your baby's growth.  The fetal heartbeat will be listened to.  Any test results from the previous visit will be discussed.  You may have a cervical check near your due date to see if you have effaced. At around 36 weeks, your caregiver will check your cervix. At the same time, your caregiver will also perform a test on the secretions of the vaginal tissue. This test is to determine if a type of bacteria, Group B streptococcus, is present. Your caregiver will explain this further. Your caregiver may ask you:  What your birth plan is.  How you are feeling.  If you are feeling the baby move.  If you have had any abnormal symptoms, such as leaking fluid, bleeding, severe headaches, or abdominal cramping.  If you have any questions. Other tests or screenings that may be performed during your third trimester include:  Blood tests that check for low iron levels (anemia).  Fetal testing to check the health, activity level, and growth of the fetus. Testing is done if you have certain medical conditions or if there are problems during the pregnancy. FALSE LABOR You may feel small, irregular contractions that eventually go away. These are called Braxton Hicks contractions, or false labor. Contractions may last for hours, days, or even weeks before true labor sets in. If contractions come at regular intervals, intensify, or become painful, it is best to be seen by your caregiver.  SIGNS OF LABOR   Menstrual-like cramps.  Contractions that are 5 minutes apart or less.  Contractions that start on the top of the uterus and spread down to the lower abdomen and back.  A sense of increased pelvic pressure or back pain.  A watery or bloody mucus discharge that comes from the vagina. If you have any of these signs before the 37th week of pregnancy, call your caregiver right away. You need to go to the hospital to  get checked immediately. HOME CARE INSTRUCTIONS   Avoid all smoking, herbs, alcohol, and unprescribed drugs. These chemicals affect the formation and growth of the baby.  Follow your caregiver's instructions regarding medicine use. There are medicines that are either safe or unsafe to take during pregnancy.  Exercise only as directed by your caregiver. Experiencing uterine cramps is a good sign to stop exercising.  Continue to eat regular, healthy meals.  Wear a good support bra for breast tenderness.  Do not use hot tubs, steam rooms, or saunas.  Wear your seat belt at all times when driving.  Avoid raw meat, uncooked cheese, cat litter boxes, and soil used by cats. These carry germs that can cause birth defects in the baby.  Take your prenatal vitamins.  Try taking a stool softener (if your caregiver approves) if  you develop constipation. Eat more high-fiber foods, such as fresh vegetables or fruit and whole grains. Drink plenty of fluids to keep your urine clear or pale yellow.  Take warm sitz baths to soothe any pain or discomfort caused by hemorrhoids. Use hemorrhoid cream if your caregiver approves.  If you develop varicose veins, wear support hose. Elevate your feet for 15 minutes, 3-4 times a day. Limit salt in your diet.  Avoid heavy lifting, wear low heal shoes, and practice good posture.  Rest a lot with your legs elevated if you have leg cramps or low back pain.  Visit your dentist if you have not gone during your pregnancy. Use a soft toothbrush to brush your teeth and be gentle when you floss.  A sexual relationship may be continued unless your caregiver directs you otherwise.  Do not travel far distances unless it is absolutely necessary and only with the approval of your caregiver.  Take prenatal classes to understand, practice, and ask questions about the labor and delivery.  Make a trial run to the hospital.  Pack your hospital bag.  Prepare the baby's  nursery.  Continue to go to all your prenatal visits as directed by your caregiver. SEEK MEDICAL CARE IF:  You are unsure if you are in labor or if your water has broken.  You have dizziness.  You have mild pelvic cramps, pelvic pressure, or nagging pain in your abdominal area.  You have persistent nausea, vomiting, or diarrhea.  You have a bad smelling vaginal discharge.  You have pain with urination. SEEK IMMEDIATE MEDICAL CARE IF:   You have a fever.  You are leaking fluid from your vagina.  You have spotting or bleeding from your vagina.  You have severe abdominal cramping or pain.  You have rapid weight loss or gain.  You have shortness of breath with chest pain.  You notice sudden or extreme swelling of your face, hands, ankles, feet, or legs.  You have not felt your baby move in over an hour.  You have severe headaches that do not go away with medicine.  You have vision changes. Document Released: 09/17/2001 Document Revised: 09/28/2013 Document Reviewed: 11/24/2012 [facility] Patient Information 2015 [place], [place]. This information is not intended to replace advice given to you by your health care provider. Make sure you discuss any questions you have with your health care provider.

## 2019-02-09 NOTE — Progress Notes (Signed)
Pt has 1+ glucose in urine. CBG 199. States that she had a smoothie for breakfast. Offered tdap, patient refused today.

## 2019-02-09 NOTE — Progress Notes (Addendum)
TELEHEALTH VIRTUAL OBSTETRICS VISIT ENCOUNTER NOTE Patient name: Kathleen Patrick MRN [id number]  Date of birth: July 28, 1991  I connected with patient on 02/09/19 at  8:45 AM EDT by [facility] and verified that I am speaking with the correct person using two identifiers. Due to COVID-19 recommendations, pt is not currently in our office.  Pt here earlier for BTL consent and bp check- doesn't have home bp cuff.    I discussed the limitations, risks, security and privacy concerns of performing an evaluation and management service by telephone and the availability of in person appointments. I also discussed with the patient that there may be a patient responsible charge related to this service. The patient expressed understanding and agreed to proceed.  Chief Complaint:   Routine Prenatal Visit  History of Present Illness:   Kathleen Patrick is a 28 y.o. [phone] female at 1110w1d with an Estimated Date of Delivery: 03/29/19 being evaluated today for ongoing management of a low-risk pregnancy.  Today she reports no complaints. Drank a smoothie from [employer] on way here, had 1+ glucosuria, so CBG checked and 199. Passed 2hr GTT @ 26wks. Contractions: Not present. Vag. Bleeding: None.  Movement: Present. denies leaking of fluid. Review of Systems:   Pertinent items are noted in HPI Denies abnormal vaginal discharge w/ itching/odor/irritation, headaches, visual changes, shortness of breath, chest pain, abdominal pain, severe nausea/vomiting, or problems with urination or bowel movements unless otherwise stated above. Pertinent History Reviewed:  Reviewed past medical,surgical, social, obstetrical and family history.  Reviewed problem list, medications and allergies. Physical Assessment:   Vitals:   02/09/19 0901  BP: 135/88  Pulse: 98  Weight: 238 lb 12.8 oz (108.3 kg)  Body mass index is 40.99 kg/m.        Physical Examination:   General:  Alert, oriented and cooperative.   Mental Status:  Normal mood and affect perceived. Normal judgment and thought content.  Rest of physical exam deferred due to type of encounter  Results for orders placed or performed in visit on 02/09/19 (from the past 24 hour(s))  POC Urinalysis Dipstick OB   Collection Time: 02/09/19  9:02 AM  Result Value Ref Range   Color, UA     Clarity, UA     Glucose, UA Small (1+) (A) Negative   Bilirubin, UA     Ketones, UA small    Spec Grav, UA     Blood, UA neg    pH, UA     POC,PROTEIN,UA Negative Negative, Trace, Small (1+), Moderate (2+), Large (3+), 4+   Urobilinogen, UA     Nitrite, UA neg    Leukocytes, UA Negative Negative   Appearance     Odor    POCT Glucose (Device for Home Use)   Collection Time: 02/09/19  9:09 AM  Result Value Ref Range   Glucose Fasting, POC     POC Glucose 199 (A) 70 - 99 mg/dl    Assessment & Plan:  1) Pregnancy [id number] at 110w1d with an Estimated Date of Delivery: 03/29/19   2) H/O pre-e, ASA  3) Glucosuria w/ elevated CBG of 199> offered QID testing vs repeat GTT, wants to repeat GTT  4) Wants BTL> reviewed risks/benefits, high incidence regret <30yo, LARCs just as effective, consent signed today   Meds:  Meds ordered this encounter  Medications  . Blood Pressure Monitor MISC    Sig: For regular home bp monitoring during pregnancy    Dispense:  1 each    Refill:  0    Dx: z34.90    Order Specific Question:   Supervising Provider    Answer:   Lazaro Arms [[id number]]    Labs/procedures today: none  Plan:  Continue routine obstetrical care. Does not have BP cuff.  Order faxed to [facility] care. Let us know if doesn't get it w/in 1wk. Check bp weekly, let us know if >140/90.   Reviewed: Preterm labor symptoms and general obstetric precautions including but not limited to vaginal bleeding, contractions, leaking of fluid and fetal movement were reviewed in detail with the patient. The patient was advised to call back or seek an in-person office  evaluation/go to [facility] at [facility] for any urgent or concerning symptoms. All questions were answered. Please refer to After Visit Summary for other counseling recommendations.   I provided 15 minutes of non-face-to-face time during this encounter.  Follow-up: Return in about 1 week (around 02/16/2019) for sugar test only, then 2wks for LROB webex.  Orders Placed This Encounter  Procedures  . POC Urinalysis Dipstick OB  . POCT Glucose (Device for Home Use)   Cheral Marker CNM, [facility] 02/09/2019 9:40 AM

## 2019-02-16 ENCOUNTER — Other Ambulatory Visit: Payer: Medicaid Other

## 2019-02-17 LAB — GLUCOSE TOLERANCE, 2 HOURS W/ 1HR
Glucose, 1 hour: 211 mg/dL — ABNORMAL HIGH (ref 65–179)
Glucose, 2 hour: 146 mg/dL (ref 65–152)
Glucose, Fasting: 102 mg/dL — ABNORMAL HIGH (ref 65–91)

## 2019-02-18 ENCOUNTER — Other Ambulatory Visit: Payer: Self-pay | Admitting: *Deleted

## 2019-02-18 ENCOUNTER — Encounter: Payer: Self-pay | Admitting: Women's Health

## 2019-02-18 ENCOUNTER — Encounter: Payer: Self-pay | Admitting: *Deleted

## 2019-02-18 ENCOUNTER — Other Ambulatory Visit: Payer: Self-pay | Admitting: Women's Health

## 2019-02-18 DIAGNOSIS — O24410 Gestational diabetes mellitus in pregnancy, diet controlled: Secondary | ICD-10-CM

## 2019-02-18 DIAGNOSIS — Z8759 Personal history of other complications of pregnancy, childbirth and the puerperium: Secondary | ICD-10-CM | POA: Insufficient documentation

## 2019-02-18 MED ORDER — ACCU-CHEK FASTCLIX LANCETS MISC: 1.0000 | Freq: Four times a day (QID) | 12 refills | Status: DC

## 2019-02-18 MED ORDER — GLUCOSE BLOOD VI STRP: ORAL_STRIP | 12 refills | Status: DC

## 2019-02-18 MED ORDER — ACCU-CHEK GUIDE ME W/DEVICE KIT: 1.0000 | PACK | Freq: Four times a day (QID) | 0 refills | Status: DC

## 2019-02-22 ENCOUNTER — Encounter: Payer: Self-pay | Admitting: *Deleted

## 2019-02-23 ENCOUNTER — Encounter: Payer: Self-pay | Admitting: Women's Health

## 2019-02-23 ENCOUNTER — Ambulatory Visit (INDEPENDENT_AMBULATORY_CARE_PROVIDER_SITE_OTHER): Payer: Medicaid Other | Admitting: Women's Health

## 2019-02-23 ENCOUNTER — Other Ambulatory Visit: Payer: Self-pay

## 2019-02-23 VITALS — BP 147/92 | HR 94

## 2019-02-23 DIAGNOSIS — R030 Elevated blood-pressure reading, without diagnosis of hypertension: Secondary | ICD-10-CM | POA: Diagnosis not present

## 2019-02-23 DIAGNOSIS — Z3A35 35 weeks gestation of pregnancy: Secondary | ICD-10-CM | POA: Diagnosis not present

## 2019-02-23 DIAGNOSIS — O24410 Gestational diabetes mellitus in pregnancy, diet controlled: Secondary | ICD-10-CM

## 2019-02-23 DIAGNOSIS — O0993 Supervision of high risk pregnancy, unspecified, third trimester: Secondary | ICD-10-CM

## 2019-02-23 NOTE — Patient Instructions (Signed)
Kathleen Patrick, I greatly value your feedback.  If you receive a survey following your visit with [place] today, we appreciate you taking the time to fill it out.  Thanks, [name], CNM, [facility]  [facility] HOSPITAL HAS MOVED!!! It is now [facility] at [facility] ([address] [place], [place] [address]) Entrance located off of [address] [facility] Free 24/7 valet parking   Check your blood pressure 4 times daily and keep a log, bring this with you to all appointments.  If blood pressure is >=160 on top or >=110 on bottom, check again in 5 minutes, if still this high, call us or go to [facility] to be evaluated    Call the office [phone]) or go to [facility] if:  You begin to have strong, frequent contractions  Your water breaks.  Sometimes it is a big gush of fluid, sometimes it is just a trickle that keeps getting your panties wet or running down your legs  You have vaginal bleeding.  It is normal to have a small amount of spotting if your cervix was checked.   You don't feel your baby moving like normal.  If you don't, get you something to eat and drink and lay down and focus on feeling your baby move.  You should feel at least 10 movements in 2 hours.  If you don't, you should call the office or go to [facility].   Call the office [phone]) or go to [facility] hospital for these signs of pre-eclampsia:  Severe headache that does not go away with Tylenol  Visual changes- seeing spots, double, blurred vision  Pain under your right breast or upper abdomen that does not go away with Tums or heartburn medicine  Nausea and/or vomiting  Severe swelling in your hands, feet, and face     Preeclampsia and Eclampsia  Preeclampsia is a serious condition that may develop during pregnancy. It is also called toxemia of pregnancy. This condition causes high blood pressure along with other symptoms, such as swelling and headaches. These symptoms may develop as the  condition gets worse. Preeclampsia may occur at 20 weeks of pregnancy or later. Diagnosing and treating preeclampsia early is very important. If not treated early, it can cause serious problems for you and your baby. One problem it can lead to is eclampsia. Eclampsia is a condition that causes muscle jerking or shaking (convulsions or seizures) and other serious problems for the mother. During pregnancy, delivering your baby may be the best treatment for preeclampsia or eclampsia. For most women, preeclampsia and eclampsia symptoms go away after giving birth. In rare cases, a woman may develop preeclampsia after giving birth (postpartum preeclampsia). This usually occurs within 48 hours after childbirth but may occur up to 6 weeks after giving birth. What are the causes? The cause of preeclampsia is not known. What increases the risk? The following risk factors make you more likely to develop preeclampsia:  Being pregnant for the first time.  Having had preeclampsia during a past pregnancy.  Having a family history of preeclampsia.  Having high blood pressure.  Being pregnant with more than one baby.  Being 57 or older.  Being African-American.  Having kidney disease or diabetes.  Having medical conditions such as lupus or blood diseases.  Being very overweight (obese). What are the signs or symptoms? The earliest signs of preeclampsia are:  High blood pressure.  Increased protein in your urine. Your health care provider will check for this  at every visit before you give birth (prenatal visit). Other symptoms that may develop as the condition gets worse include:  Severe headaches.  Sudden weight gain.  Swelling of the hands, face, legs, and feet.  Nausea and vomiting.  Vision problems, such as blurred or double vision.  Numbness in the face, arms, legs, and feet.  Urinating less than usual.  Dizziness.  Slurred speech.  Abdominal pain, especially upper abdominal  pain.  Convulsions or seizures. How is this diagnosed? There are no screening tests for preeclampsia. Your health care provider will ask you about symptoms and check for signs of preeclampsia during your prenatal visits. You may also have tests that include:  Urine tests.  Blood tests.  Checking your blood pressure.  Monitoring your baby's heart rate.  Ultrasound. How is this treated? You and your health care provider will determine the treatment approach that is best for you. Treatment may include:  Having more frequent prenatal exams to check for signs of preeclampsia, if you have an increased risk for preeclampsia.  Medicine to lower your blood pressure.  Staying in the hospital, if your condition is severe. There, treatment will focus on controlling your blood pressure and the amount of fluids in your body (fluid retention).  Taking medicine (magnesium sulfate) to prevent seizures. This may be given as an injection or through an IV.  Taking a low-dose aspirin during your pregnancy.  Delivering your baby early, if your condition gets worse. You may have your labor started with medicine (induced), or you may have a cesarean delivery. Follow these instructions at home: Eating and drinking   Drink enough fluid to keep your urine pale yellow.  Avoid caffeine. Lifestyle  Do not use any products that contain nicotine or tobacco, such as cigarettes and e-cigarettes. If you need help quitting, ask your health care provider.  Do not use alcohol or drugs.  Avoid stress as much as possible. Rest and get plenty of sleep. General instructions  Take over-the-counter and prescription medicines only as told by your health care provider.  When lying down, lie on your left side. This keeps pressure off your major blood vessels.  When sitting or lying down, raise (elevate) your feet. Try putting some pillows underneath your lower legs.  Exercise regularly. Ask your health care  provider what kinds of exercise are best for you.  Keep all follow-up and prenatal visits as told by your health care provider. This is important. How is this prevented? There is no known way of preventing preeclampsia or eclampsia from developing. However, to lower your risk of complications and detect problems early:  Get regular prenatal care. Your health care provider may be able to diagnose and treat the condition early.  Maintain a healthy weight. Ask your health care provider for help managing weight gain during pregnancy.  Work with your health care provider to manage any long-term (chronic) health conditions you have, such as diabetes or kidney problems.  You may have tests of your blood pressure and kidney function after giving birth.  Your health care provider may have you take low-dose aspirin during your next pregnancy. Contact a health care provider if:  You have symptoms that your health care provider told you may require more treatment or monitoring, such as: ? Headaches. ? Nausea or vomiting. ? Abdominal pain. ? Dizziness. ? Light-headedness. Get help right away if:  You have severe: ? Abdominal pain. ? Headaches that do not get better. ? Dizziness. ? Vision problems. ?  Confusion. ? Nausea or vomiting.  You have any of the following: ? A seizure. ? Sudden, rapid weight gain. ? Sudden swelling in your hands, ankles, or face. ? Trouble moving any part of your body. ? Numbness in any part of your body. ? Trouble speaking. ? Abnormal bleeding.  You faint. Summary  Preeclampsia is a serious condition that may develop during pregnancy. It is also called toxemia of pregnancy.  This condition causes high blood pressure along with other symptoms, such as swelling and headaches.  Diagnosing and treating preeclampsia early is very important. If not treated early, it can cause serious problems for you and your baby.  Get help right away if you have symptoms  that your health care provider told you to watch for. This information is not intended to replace advice given to you by your health care provider. Make sure you discuss any questions you have with your health care provider. Document Released: 09/20/2000 Document Revised: 09/09/2017 Document Reviewed: 04/29/2016 [employer] Interactive Patient Education  2019 [employer]

## 2019-02-23 NOTE — Progress Notes (Signed)
TELEHEALTH VIRTUAL OBSTETRICS VISIT ENCOUNTER NOTE Patient name: Kathleen Patrick MRN [id number]  Date of birth: [birth year]  I connected with patient on 02/23/19 at  8:45 AM EDT by [facility] and verified that I am speaking with the correct person using two identifiers. Due to COVID-19 recommendations, pt is not currently in our office.    I discussed the limitations, risks, security and privacy concerns of performing an evaluation and management service by telephone and the availability of in person appointments. I also discussed with the patient that there may be a patient responsible charge related to this service. The patient expressed understanding and agreed to proceed.  Chief Complaint:   High Risk Gestation  History of Present Illness:   Shabana Moschella is a 28 y.o. [phone] female at [redacted]w[redacted]d with an Estimated Date of Delivery: 03/29/19 being evaluated today for ongoing management of a high-risk pregnancy complicated by A1DM, elevated bp today w/ h/o pre-e.  Today she reports always has headaches, but has worsened over past 2 wks-doesn't take meds, intermittently sees 1 black spot out of Lt eye, denies ruq/epigastric pain, n/v. BP 146/92 last week, didn't notify us. H/O pre-e, taking baby ASA as directed. Dx w/ A1DM last week after repeat GTT d/t glucosuria w/ fingerstick glucose of 199 at appt. 2hrGTT results this time 102/211/146. Had passed earlier @ 26wks. Has appt tomorrow w/ dietician.  . Contractions: Not present.  .  Movement: Present. denies leaking of fluid. Review of Systems:   Pertinent items are noted in HPI Denies abnormal vaginal discharge w/ itching/odor/irritation, headaches, visual changes, shortness of breath, chest pain, abdominal pain, severe nausea/vomiting, or problems with urination or bowel movements unless otherwise stated above. Pertinent History Reviewed:  Reviewed past medical,surgical, social, obstetrical and family history.  Reviewed problem list, medications  and allergies. Physical Assessment:   Vitals:   02/23/19 0853  BP: (!) 147/92  Pulse: 94  There is no height or weight on file to calculate BMI.        Physical Examination:   General:  Alert, oriented and cooperative.   Mental Status: Normal mood and affect perceived. Normal judgment and thought content.  Rest of physical exam deferred due to type of encounter  No results found for this or any previous visit (from the past 24 hour(s)).  Assessment & Plan:  1) Pregnancy [id number] at [redacted]w[redacted]d with an Estimated Date of Delivery: 03/29/19   2) A1DM, dx last week, pick up supplies today, check QID, go to dietician tomorrow  3) Elevated home bp x 2> w/ h/o pre-e, come today for labs, check bp QID, call/go to [facility] if 160/110, f/u in office in 2d. Reviewed pre-e s/s, reasons to seek care   Meds: No orders of the defined types were placed in this encounter.   Labs/procedures today: cbc, cmp, p:c ratio  Plan:  Continue routine obstetrical care.   Reviewed: Preterm labor symptoms and general obstetric precautions including but not limited to vaginal bleeding, contractions, leaking of fluid and fetal movement were reviewed in detail with the patient. The patient was advised to call back or seek an in-person office evaluation/go to [facility]U at [facility] for any urgent or concerning symptoms. All questions were answered. Please refer to After Visit Summary for other counseling recommendations.   I provided 15 minutes of non-face-to-face time during this encounter.  Follow-up: Return for today for labs, then 2d for HROB.  Orders Placed This Encounter  Procedures  . CBC  . Comprehensive metabolic  panel  . Protein / creatinine ratio, urine   Cheral MarkerKimberly R Kanishk Stroebel CNM, [facility]NP-BC 02/23/2019 9:20 AM

## 2019-02-24 ENCOUNTER — Encounter: Payer: Medicaid Other | Attending: Advanced Practice Midwife | Admitting: *Deleted

## 2019-02-24 ENCOUNTER — Encounter: Payer: Self-pay | Admitting: *Deleted

## 2019-02-24 DIAGNOSIS — O24410 Gestational diabetes mellitus in pregnancy, diet controlled: Secondary | ICD-10-CM | POA: Insufficient documentation

## 2019-02-24 LAB — CBC
Hematocrit: 34.7 % (ref 34.0–46.6)
Hemoglobin: 11.7 g/dL (ref 11.1–15.9)
MCH: 29 pg (ref 26.6–33.0)
MCHC: 33.7 g/dL (ref 31.5–35.7)
MCV: 86 fL (ref 79–97)
Platelets: 118 10*3/uL — ABNORMAL LOW (ref 150–450)
RBC: 4.03 x10E6/uL (ref 3.77–5.28)
RDW: 11.9 % (ref 11.7–15.4)
WBC: 7.4 10*3/uL (ref 3.4–10.8)

## 2019-02-24 LAB — COMPREHENSIVE METABOLIC PANEL
ALT: 9 IU/L (ref 0–32)
AST: 10 IU/L (ref 0–40)
Albumin/Globulin Ratio: 1.3 (ref 1.2–2.2)
Albumin: 3.5 g/dL — ABNORMAL LOW (ref 3.9–5.0)
Alkaline Phosphatase: 109 IU/L (ref 39–117)
BUN/Creatinine Ratio: 10 (ref 9–23)
BUN: 5 mg/dL — ABNORMAL LOW (ref 6–20)
Bilirubin Total: 0.3 mg/dL (ref 0.0–1.2)
CO2: 21 mmol/L (ref 20–29)
Calcium: 8.8 mg/dL (ref 8.7–10.2)
Chloride: 104 mmol/L (ref 96–106)
Creatinine, Ser: 0.51 mg/dL — ABNORMAL LOW (ref 0.57–1.00)
GFR calc Af Amer: 152 mL/min/{1.73_m2} (ref >59)
GFR calc non Af Amer: 132 mL/min/{1.73_m2} (ref >59)
Globulin, Total: 2.8 g/dL (ref 1.5–4.5)
Glucose: 79 mg/dL (ref 65–99)
Potassium: 3.8 mmol/L (ref 3.5–5.2)
Sodium: 139 mmol/L (ref 134–144)
Total Protein: 6.3 g/dL (ref 6.0–8.5)

## 2019-02-24 LAB — PROTEIN / CREATININE RATIO, URINE
Creatinine, Urine: 120.2 mg/dL
Protein, Ur: 16.5 mg/dL
Protein/Creat Ratio: 137 mg/g creat (ref 0–200)

## 2019-02-24 NOTE — Progress Notes (Signed)
  Patient was seen on 02/24/2019 for Gestational Diabetes self-management. EDD 03/29/2019. She states her MD has planned for her to deliver in 2 weeks. Patient states no history of GDM. Diet history obtained. Patient eats good variety of all food groups. Beverages include mostly water with some sweet tea.  The following learning objectives were met by the patient :   States the definition of Gestational Diabetes  States why dietary management is important in controlling blood glucose  Describes the effects of carbohydrates on blood glucose levels  Demonstrates ability to create a balanced meal plan  Demonstrates carbohydrate counting   States when to check blood glucose levels  Demonstrates proper blood glucose monitoring techniques  States the effect of stress and exercise on blood glucose levels  States the importance of limiting caffeine and abstaining from alcohol and smoking  Plan:  Aim for 3 Carb Choices per meal (45 grams) +/- 1 either way  Aim for 1-2 Carbs per snack Begin reading food labels for Total Carbohydrate of foods If OK with your MD, consider  increasing your activity level by walking, Arm Chair Exercises or other activity daily as tolerated Begin checking BG before breakfast and 2 hours after first bite of breakfast, lunch and dinner as directed by MD  Bring Log Book/Sheet and meter to every medical appointment  Take medication if directed by MD  Patient already has a meter: Accu Chek Guide she got yesterday Patient instructed to test pre breakfast and 2 hours each meal as directed by MD  Patient instructed to monitor glucose levels: FBS: 60 - 95 mg/dl 2 hour: <120 mg/dl  Patient received the following handouts:  Nutrition Diabetes and Pregnancy  Carbohydrate Counting List  BG Log Sheet  Patient will be seen for follow-up as needed.

## 2019-02-24 NOTE — Patient Instructions (Signed)
Plan:  Aim for 3 Carb Choices per meal (45 grams) +/- 1 either way  Aim for 1-2 Carbs per snack Begin reading food labels for Total Carbohydrate of foods If OK with your MD, consider  increasing your activity level by walking, Arm Chair Exercises or other activity daily as tolerated Begin checking BG before breakfast and 2 hours after first bite of breakfast, lunch and dinner as directed by MD  Bring Log Book/Sheet and meter to every medical appointment  Take medication if directed by MD

## 2019-02-25 ENCOUNTER — Other Ambulatory Visit: Payer: Self-pay

## 2019-02-25 ENCOUNTER — Ambulatory Visit (INDEPENDENT_AMBULATORY_CARE_PROVIDER_SITE_OTHER): Payer: Medicaid Other | Admitting: Obstetrics & Gynecology

## 2019-02-25 VITALS — BP 132/92 | HR 104 | Wt 243.0 lb

## 2019-02-25 DIAGNOSIS — Z3A35 35 weeks gestation of pregnancy: Secondary | ICD-10-CM

## 2019-02-25 DIAGNOSIS — O0993 Supervision of high risk pregnancy, unspecified, third trimester: Secondary | ICD-10-CM

## 2019-02-25 DIAGNOSIS — Z1389 Encounter for screening for other disorder: Secondary | ICD-10-CM

## 2019-02-25 DIAGNOSIS — O133 Gestational [pregnancy-induced] hypertension without significant proteinuria, third trimester: Secondary | ICD-10-CM

## 2019-02-25 DIAGNOSIS — Z331 Pregnant state, incidental: Secondary | ICD-10-CM

## 2019-02-25 DIAGNOSIS — O24410 Gestational diabetes mellitus in pregnancy, diet controlled: Secondary | ICD-10-CM

## 2019-02-25 LAB — POCT URINALYSIS DIPSTICK OB
Blood, UA: NEGATIVE
Glucose, UA: NEGATIVE
Ketones, UA: NEGATIVE
Leukocytes, UA: NEGATIVE
Nitrite, UA: NEGATIVE

## 2019-02-25 MED ORDER — OMEPRAZOLE 20 MG PO CPDR: 20.0000 mg | DELAYED_RELEASE_CAPSULE | Freq: Every day | ORAL | 6 refills | Status: DC

## 2019-02-25 NOTE — Progress Notes (Signed)
   HIGH-RISK PREGNANCY VISIT Patient name: Kathleen Patrick MRN [id number]  Date of birth: 17-Mar-1991 Chief Complaint:   Routine Prenatal Visit  History of Present Illness:   Kathleen Patrick is a 28 y.o. [phone] female at [redacted]w[redacted]d with an Estimated Date of Delivery: 03/29/19 being seen today for ongoing management of a high-risk pregnancy complicated by gestational HTN, class  A1 DM.  Today she reports no complaints. Contractions: Not present. Vag. Bleeding: None.  Movement: Present. denies leaking of fluid.  Review of Systems:   Pertinent items are noted in HPI Denies abnormal vaginal discharge w/ itching/odor/irritation, headaches, visual changes, shortness of breath, chest pain, abdominal pain, severe nausea/vomiting, or problems with urination or bowel movements unless otherwise stated above. Pertinent History Reviewed:  Reviewed past medical,surgical, social, obstetrical and family history.  Reviewed problem list, medications and allergies. Physical Assessment:   Vitals:   02/25/19 0909  BP: (!) 132/92  Pulse: (!) 104  Weight: 243 lb (110.2 kg)  Body mass index is 41.71 kg/m.           Physical Examination:   General appearance: alert, well appearing, and in no distress  Mental status: alert, oriented to person, place, and time  Skin: warm & dry   Extremities: Edema: None    Cardiovascular: normal heart rate noted  Respiratory: normal respiratory effort, no distress  Abdomen: gravid, soft, non-tender  Pelvic: Cervical exam deferred         Fetal Status: Fetal Heart Rate (bpm): 132 Fundal Height: 40 cm Movement: Present    Fetal Surveillance Testing today: FHR 132   Results for orders placed or performed in visit on 02/25/19 (from the past 24 hour(s))  POC Urinalysis Dipstick OB   Collection Time: 02/25/19  9:13 AM  Result Value Ref Range   Color, UA     Clarity, UA     Glucose, UA Negative Negative   Bilirubin, UA     Ketones, UA neg    Spec Grav, UA     Blood, UA  neg    pH, UA     POC,PROTEIN,UA Trace Negative, Trace, Small (1+), Moderate (2+), Large (3+), 4+   Urobilinogen, UA     Nitrite, UA neg    Leukocytes, UA Negative Negative   Appearance     Odor      Assessment & Plan:  1) High-risk pregnancy [id number] at [redacted]w[redacted]d with an Estimated Date of Delivery: 03/29/19   2) Gestational hypertension, stable, all labs are normal  3) Class A1 DM, stable  Meds:  Meds ordered this encounter  Medications  . omeprazole (PRILOSEC) 20 MG capsule    Sig: Take 1 capsule (20 mg total) by mouth daily. 1 tablet a day    Dispense:  30 capsule    Refill:  6    Labs/procedures today:   Treatment Plan:  Begin twice weekly in office surveillance with alternating BPP +Dopplers and NST, looking like IOL 37-38 weeks depending on clinical picture  Reviewed: Preterm labor symptoms and general obstetric precautions including but not limited to vaginal bleeding, contractions, leaking of fluid and fetal movement were reviewed in detail with the patient.  All questions were answered.  Follow-up: Return in about 4 days (around 03/01/2019) for BPP/sono, HROB.  Orders Placed This Encounter  Procedures  . US Fetal BPP W/O Non Stress  . [place] UA Cord Doppler  . POC Urinalysis Dipstick OB   Kathleen Patrick  02/25/2019 10:02 AM

## 2019-02-26 ENCOUNTER — Other Ambulatory Visit: Payer: Self-pay | Admitting: Obstetrics & Gynecology

## 2019-02-26 ENCOUNTER — Encounter: Payer: Self-pay | Admitting: *Deleted

## 2019-03-02 ENCOUNTER — Other Ambulatory Visit: Payer: Self-pay | Admitting: Obstetrics & Gynecology

## 2019-03-02 ENCOUNTER — Ambulatory Visit (INDEPENDENT_AMBULATORY_CARE_PROVIDER_SITE_OTHER): Payer: Medicaid Other

## 2019-03-02 ENCOUNTER — Other Ambulatory Visit: Payer: Self-pay

## 2019-03-02 ENCOUNTER — Ambulatory Visit (INDEPENDENT_AMBULATORY_CARE_PROVIDER_SITE_OTHER): Payer: Medicaid Other | Admitting: Obstetrics & Gynecology

## 2019-03-02 ENCOUNTER — Encounter: Payer: Self-pay | Admitting: Obstetrics & Gynecology

## 2019-03-02 VITALS — BP 139/98 | HR 104 | Wt 243.0 lb

## 2019-03-02 DIAGNOSIS — Z1389 Encounter for screening for other disorder: Secondary | ICD-10-CM

## 2019-03-02 DIAGNOSIS — Z3A36 36 weeks gestation of pregnancy: Secondary | ICD-10-CM | POA: Diagnosis not present

## 2019-03-02 DIAGNOSIS — Z362 Encounter for other antenatal screening follow-up: Secondary | ICD-10-CM

## 2019-03-02 DIAGNOSIS — O0990 Supervision of high risk pregnancy, unspecified, unspecified trimester: Secondary | ICD-10-CM

## 2019-03-02 DIAGNOSIS — O0993 Supervision of high risk pregnancy, unspecified, third trimester: Secondary | ICD-10-CM

## 2019-03-02 DIAGNOSIS — Z331 Pregnant state, incidental: Secondary | ICD-10-CM

## 2019-03-02 DIAGNOSIS — O133 Gestational [pregnancy-induced] hypertension without significant proteinuria, third trimester: Secondary | ICD-10-CM

## 2019-03-02 DIAGNOSIS — O24410 Gestational diabetes mellitus in pregnancy, diet controlled: Secondary | ICD-10-CM

## 2019-03-02 LAB — POCT URINALYSIS DIPSTICK OB
Blood, UA: NEGATIVE
Glucose, UA: NEGATIVE
Leukocytes, UA: NEGATIVE
Nitrite, UA: NEGATIVE

## 2019-03-02 NOTE — Progress Notes (Signed)
[place] 36+1 wks,cephalic,posterior placenta gr 1,afi 16.5 cm,normal ovaries bilat,BPP 8/8,RI .64,.67,.65,.60=78%,EFW 3056 g 71%

## 2019-03-02 NOTE — Progress Notes (Signed)
   HIGH-RISK PREGNANCY VISIT Patient name: Kathleen Patrick MRN [id number]  Date of birth: [birth year] Chief Complaint:   High Risk Gestation (BPP)  History of Present Illness:   Kathleen Patrick is a 28 y.o. [phone] female at [redacted]w[redacted]d with an Estimated Date of Delivery: 03/29/19 being seen today for ongoing management of a high-risk pregnancy complicated by gestational HTN.  Today she reports no complaints. Contractions: Not present.  .  Movement: Present. denies leaking of fluid.  Review of Systems:   Pertinent items are noted in HPI Denies abnormal vaginal discharge w/ itching/odor/irritation, headaches, visual changes, shortness of breath, chest pain, abdominal pain, severe nausea/vomiting, or problems with urination or bowel movements unless otherwise stated above. Pertinent History Reviewed:  Reviewed past medical,surgical, social, obstetrical and family history.  Reviewed problem list, medications and allergies. Physical Assessment:   Vitals:   03/02/19 1601  BP: (!) 139/98  Pulse: (!) 104  Weight: 243 lb (110.2 kg)  Body mass index is 41.71 kg/m.           Physical Examination:   General appearance: alert, well appearing, and in no distress  Mental status: alert, oriented to person, place, and time  Skin: warm & dry   Extremities: Edema: None    Cardiovascular: normal heart rate noted  Respiratory: normal respiratory effort, no distress  Abdomen: gravid, soft, non-tender  Pelvic: Cervical exam deferred         Fetal Status:     Movement: Present    Fetal Surveillance Testing today: BPP 8/8 with normal Dopplers   Results for orders placed or performed in visit on 03/02/19 (from the past 24 hour(s))  POC Urinalysis Dipstick OB   Collection Time: 03/02/19  4:07 PM  Result Value Ref Range   Color, UA     Clarity, UA     Glucose, UA Negative Negative   Bilirubin, UA     Ketones, UA large    Spec Grav, UA     Blood, UA neg    pH, UA     POC,PROTEIN,UA Trace Negative,  Trace, Small (1+), Moderate (2+), Large (3+), 4+   Urobilinogen, UA     Nitrite, UA neg    Leukocytes, UA Negative Negative   Appearance     Odor      Assessment & Plan:  1) High-risk pregnancy [id number] at [redacted]w[redacted]d with an Estimated Date of Delivery: 03/29/19   2) Class A1 DM, stable, CBG about 90 and 2 hours <120, sonogram 71%  3) Gestational hypertension, IOL 37 weeks   Meds: No orders of the defined types were placed in this encounter.   Labs/procedures today: sonogram  Treatment Plan:  IOL 03/10/2019 [redacted]w[redacted]d first available  Reviewed: Term labor symptoms and general obstetric precautions including but not limited to vaginal bleeding, contractions, leaking of fluid and fetal movement were reviewed in detail with the patient.  All questions were answered.  Follow-up: Return in about 3 days (around 03/05/2019) for NST + cultures.  Orders Placed This Encounter  Procedures  . POC Urinalysis Dipstick OB   Lazaro Arms 03/02/2019 4:32 PM

## 2019-03-03 ENCOUNTER — Telehealth (HOSPITAL_COMMUNITY): Payer: Self-pay | Admitting: *Deleted

## 2019-03-03 ENCOUNTER — Encounter (HOSPITAL_COMMUNITY): Payer: Self-pay | Admitting: *Deleted

## 2019-03-03 NOTE — Telephone Encounter (Signed)
Preadmission screen  

## 2019-03-04 ENCOUNTER — Encounter: Payer: Self-pay | Admitting: *Deleted

## 2019-03-04 NOTE — Treatment Plan (Signed)
   Induction Assessment Scheduling Form: Fax to [facility]:  [phone]  Makinna Eakle                                                                                   DOB:  Nov 20, 1990                                                            MRN:  [id number]                                                                     Phone #:                            Provider:  [facility]  GP:  [phone]                                                            Estimated Date of Delivery: 03/29/19  Dating Criteria: LMP sonogram    Medical Indications for induction:  Gestational hypertension Admission Date/Time:  03/10/2019 @0630  Gestational age on admission:  [redacted]w[redacted]d   Filed Weights   03/02/19 1601  Weight: 243 lb (110.2 kg)   HIV:  Non Reactive (03/16 0857) [id number]    Cervical exam pending   Method of induction(proposed):  cytotec/choice   Scheduling Provider Signature:  Lazaro Arms, MD                                            Today's Date:  03/04/2019

## 2019-03-05 ENCOUNTER — Other Ambulatory Visit: Payer: Self-pay

## 2019-03-05 ENCOUNTER — Ambulatory Visit (INDEPENDENT_AMBULATORY_CARE_PROVIDER_SITE_OTHER): Payer: Medicaid Other | Admitting: Obstetrics and Gynecology

## 2019-03-05 ENCOUNTER — Other Ambulatory Visit: Payer: Self-pay | Admitting: Advanced Practice Midwife

## 2019-03-05 VITALS — BP 129/88 | HR 90 | Wt 245.6 lb

## 2019-03-05 DIAGNOSIS — O133 Gestational [pregnancy-induced] hypertension without significant proteinuria, third trimester: Secondary | ICD-10-CM | POA: Diagnosis not present

## 2019-03-05 DIAGNOSIS — O24419 Gestational diabetes mellitus in pregnancy, unspecified control: Secondary | ICD-10-CM

## 2019-03-05 DIAGNOSIS — O0993 Supervision of high risk pregnancy, unspecified, third trimester: Secondary | ICD-10-CM

## 2019-03-05 DIAGNOSIS — Z3A36 36 weeks gestation of pregnancy: Secondary | ICD-10-CM | POA: Diagnosis not present

## 2019-03-05 DIAGNOSIS — Z1389 Encounter for screening for other disorder: Secondary | ICD-10-CM

## 2019-03-05 DIAGNOSIS — O0990 Supervision of high risk pregnancy, unspecified, unspecified trimester: Secondary | ICD-10-CM

## 2019-03-05 LAB — POCT URINALYSIS DIPSTICK OB
Blood, UA: NEGATIVE
Glucose, UA: NEGATIVE
Ketones, UA: NEGATIVE
Leukocytes, UA: NEGATIVE
Nitrite, UA: NEGATIVE

## 2019-03-05 LAB — OB RESULTS CONSOLE GBS: GBS: NEGATIVE

## 2019-03-05 NOTE — Progress Notes (Addendum)
Patient ID: Kathleen Patrick, female   DOB: [birth year], 28 y.o.   MRN: [id number]    [facility] PREGNANCY VISIT Patient name: Kathleen Patrick MRN [id number]  Date of birth: 12-11-1990 Chief Complaint:   Routine Prenatal Visit (NST)  History of Present Illness:   Kathleen Patrick is a 28 y.o. [phone] female at [redacted]w[redacted]d with an Estimated Date of Delivery: 03/29/19 being seen today for ongoing management of a high-risk pregnancy complicated by gestational HTN, gestational DM. IOL 03/10/2019 with plans of tubal after delivery. Hx o pre-e in first pregnancy. 164 blood sugar this morning, normally not elevated. Today she reports no complaints. Contractions: Irritability. Vag. Bleeding: None.  Movement: Present. denies leaking of fluid.  Review of Systems:   Pertinent items are noted in HPI Denies abnormal vaginal discharge w/ itching/odor/irritation, headaches, visual changes, shortness of breath, chest pain, abdominal pain, severe nausea/vomiting, or problems with urination or bowel movements unless otherwise stated above. Pertinent History Reviewed:  Reviewed past medical,surgical, social, obstetrical and family history.  Reviewed problem list, medications and allergies. Physical Assessment:   Vitals:   03/05/19 1122  BP: 129/88  Pulse: 90  Weight: 245 lb 9.6 oz (111.4 kg)  Body mass index is 42.16 kg/m.           Physical Examination:   General appearance: alert, well appearing, and in no distress  Mental status: alert, oriented to person, place, and time, normal mood, behavior, speech, dress, motor activity, and thought processes, affect appropriate to mood  Skin: warm & dry   Extremities: Edema: None    Cardiovascular: normal heart rate noted  Respiratory: normal respiratory effort, no distress  Abdomen: gravid, soft, non-tender  Pelvic: Cervical exam performed 1 cm 20% thinned GBS done         Fetal Status:     Movement: Present    Fetal Surveillance Testing today: NST reactive    Results for orders placed or performed in visit on 03/05/19 (from the past 24 hour(s))  POC Urinalysis Dipstick OB   Collection Time: 03/05/19 11:25 AM  Result Value Ref Range   Color, UA     Clarity, UA     Glucose, UA Negative Negative   Bilirubin, UA     Ketones, UA neg    Spec Grav, UA     Blood, UA neg    pH, UA     POC,PROTEIN,UA Trace Negative, Trace, Small (1+), Moderate (2+), Large (3+), 4+   Urobilinogen, UA     Nitrite, UA neg    Leukocytes, UA Negative Negative   Appearance     Odor      Assessment & Plan:  1) High-risk pregnancy [id number] at [redacted]w[redacted]d with an Estimated Date of Delivery: 03/29/19  Reactive NST  2) GDM A1, controlled, no medicine  3) GHTN, stable  Meds: No orders of the defined types were placed in this encounter.   Labs/procedures today: GBS/GCCHL  Treatment Plan:  IOL 03/10/2019  Reviewed: Term labor symptoms and general obstetric precautions including but not limited to vaginal bleeding, contractions, leaking of fluid and fetal movement were reviewed in detail with the patient.  All questions were answered.  Follow-up: Return in about 1 month (around 04/08/2019) for Post-Part.  Orders Placed This Encounter  Procedures   GC/Chlamydia Probe Amp   Culture, beta strep (group b only)   POC Urinalysis Dipstick OB   By signing my name below, I, Arnette Norris, attest that this documentation has been prepared under the direction and in the  presence of Tilda BurrowFerguson, John V, MD. Electronically Signed: Arnette NorrisMari Johnson Medical Scribe. 03/05/19. 11:52 AM.  I personally performed the services described in this documentation, which was SCRIBED in my presence. The recorded information has been reviewed and considered accurate. It has been edited as necessary during review. Tilda BurrowJohn V Ferguson, MD

## 2019-03-05 NOTE — Progress Notes (Signed)
Patient ID: Kathleen Patrick, female   DOB: 10/25/1990, 28 y.o.   MRN: [id number] ° ° ° °HIGH-RISK PREGNANCY VISIT °Patient name: Kathleen Patrick MRN [id number]  Date of birth: 10/14/1990 °Chief Complaint:   °Routine Prenatal Visit (NST) ° °History of Present Illness:   °[name] is a 28 y.o. G4P2103 female at [redacted]w[redacted]d with an Estimated Date of Delivery: 03/29/19 being seen today for ongoing management of a high-risk pregnancy complicated by gestational HTN, gestational DM. IOL 03/10/2019 with plans of tubal after delivery. Hx o pre-e in first pregnancy. 164 blood sugar this morning, normally not elevated. °Today she reports no complaints. Contractions: Irritability. Vag. Bleeding: None.  Movement: Present. denies leaking of fluid.  °Review of Systems:   °Pertinent items are noted in HPI °Denies abnormal vaginal discharge w/ itching/odor/irritation, headaches, visual changes, shortness of breath, chest pain, abdominal pain, severe nausea/vomiting, or problems with urination or bowel movements unless otherwise stated above. °Pertinent History Reviewed:  °Reviewed past medical,surgical, social, obstetrical and family history.  °Reviewed problem list, medications and allergies. °Physical Assessment:  ° °Vitals:  ° 03/05/19 1122  °BP: 129/88  °Pulse: 90  °Weight: 245 lb 9.6 oz (111.4 kg)  °Body mass index is 42.16 kg/m². °     °     Physical Examination:  ° General appearance: alert, well appearing, and in no distress ° Mental status: alert, oriented to person, place, and time, normal mood, behavior, speech, dress, motor activity, and thought processes, affect appropriate to mood ° Skin: warm & dry  ° Extremities: Edema: None  °  Cardiovascular: normal heart rate noted ° Respiratory: normal respiratory effort, no distress ° Abdomen: gravid, soft, non-tender ° Pelvic: Cervical exam performed 1 cm 20% thinned GBS done        ° °Fetal Status:     Movement: Present   ° °Fetal Surveillance Testing today: NST reactive   ° °Results for orders placed or performed in visit on 03/05/19 (from the past 24 hour(s))  °POC Urinalysis Dipstick OB  ° Collection Time: 03/05/19 11:25 AM  °Result Value Ref Range  ° Color, UA    ° Clarity, UA    ° Glucose, UA Negative Negative  ° Bilirubin, UA    ° Ketones, UA neg   ° Spec Grav, UA    ° Blood, UA neg   ° pH, UA    ° POC,PROTEIN,UA Trace Negative, Trace, Small (1+), Moderate (2+), Large (3+), 4+  ° Urobilinogen, UA    ° Nitrite, UA neg   ° Leukocytes, UA Negative Negative  ° Appearance    ° Odor    °  °Assessment & Plan:  °1) High-risk pregnancy G4P2103 at [redacted]w[redacted]d with an Estimated Date of Delivery: 03/29/19  Reactive NST ° °2) GDM A1, controlled, no medicine ° °3) GHTN, stable ° °Meds: No orders of the defined types were placed in this encounter. ° ° °Labs/procedures today: GBS/GCCHL ° °Treatment Plan:  IOL 03/10/2019 ° °Reviewed: Term labor symptoms and general obstetric precautions including but not limited to vaginal bleeding, contractions, leaking of fluid and fetal movement were reviewed in detail with the patient.  All questions were answered. ° °Follow-up: Return in about 1 month (around 04/08/2019) for Post-Part. ° °Orders Placed This Encounter  °Procedures  °• GC/Chlamydia Probe Amp  °• Culture, beta strep (group b only)  °• POC Urinalysis Dipstick OB  ° °By signing my name below, I, Mari Johnson, attest that this documentation has been prepared under the direction and in the   presence of Danaja Lasota V, MD. °Electronically Signed: Mari Johnson Medical Scribe. 03/05/19. 11:52 AM. ° °I personally performed the services described in this documentation, which was SCRIBED in my presence. The recorded information has been reviewed and considered accurate. It has been edited as necessary during review. °Ilan Kahrs V Arul Farabee, MD ° ° ° °

## 2019-03-08 ENCOUNTER — Other Ambulatory Visit (HOSPITAL_COMMUNITY)
Admission: RE | Admit: 2019-03-08 | Discharge: 2019-03-08 | Disposition: A | Discharge status: Home / Self Care | Payer: Medicaid Other | Source: Ambulatory Visit | Attending: Obstetrics and Gynecology | Admitting: Obstetrics and Gynecology

## 2019-03-08 ENCOUNTER — Other Ambulatory Visit: Payer: Self-pay

## 2019-03-08 DIAGNOSIS — Z1159 Encounter for screening for other viral diseases: Secondary | ICD-10-CM | POA: Diagnosis not present

## 2019-03-08 NOTE — MAU Note (Signed)
Asymptomatic, swabbed without difficulty. 

## 2019-03-09 ENCOUNTER — Other Ambulatory Visit (HOSPITAL_COMMUNITY): Payer: Self-pay | Admitting: *Deleted

## 2019-03-09 LAB — NOVEL CORONAVIRUS, NAA (HOSP ORDER, SEND-OUT TO REF LAB; TAT 18-24 HRS): SARS-CoV-2, NAA: NOT DETECTED

## 2019-03-09 LAB — CULTURE, BETA STREP (GROUP B ONLY): Strep Gp B Culture: NEGATIVE

## 2019-03-10 ENCOUNTER — Inpatient Hospital Stay (HOSPITAL_COMMUNITY)
Admission: AD | Admit: 2019-03-10 | Discharge: 2019-03-13 | DRG: 797 | Disposition: A | Discharge status: Home / Self Care | Payer: Medicaid Other | Attending: Family Medicine | Admitting: Family Medicine

## 2019-03-10 ENCOUNTER — Inpatient Hospital Stay (HOSPITAL_COMMUNITY): Payer: Medicaid Other

## 2019-03-10 ENCOUNTER — Encounter (HOSPITAL_COMMUNITY): Payer: Self-pay | Admitting: *Deleted

## 2019-03-10 ENCOUNTER — Other Ambulatory Visit: Payer: Self-pay

## 2019-03-10 DIAGNOSIS — O09299 Supervision of pregnancy with other poor reproductive or obstetric history, unspecified trimester: Secondary | ICD-10-CM

## 2019-03-10 DIAGNOSIS — Z87891 Personal history of nicotine dependence: Secondary | ICD-10-CM

## 2019-03-10 DIAGNOSIS — D696 Thrombocytopenia, unspecified: Secondary | ICD-10-CM | POA: Diagnosis present

## 2019-03-10 DIAGNOSIS — O99344 Other mental disorders complicating childbirth: Secondary | ICD-10-CM | POA: Diagnosis present

## 2019-03-10 DIAGNOSIS — D6959 Other secondary thrombocytopenia: Secondary | ICD-10-CM | POA: Diagnosis present

## 2019-03-10 DIAGNOSIS — F329 Major depressive disorder, single episode, unspecified: Secondary | ICD-10-CM | POA: Diagnosis present

## 2019-03-10 DIAGNOSIS — F32A Depression, unspecified: Secondary | ICD-10-CM | POA: Diagnosis present

## 2019-03-10 DIAGNOSIS — O134 Gestational [pregnancy-induced] hypertension without significant proteinuria, complicating childbirth: Secondary | ICD-10-CM | POA: Diagnosis present

## 2019-03-10 DIAGNOSIS — Z302 Encounter for sterilization: Secondary | ICD-10-CM | POA: Diagnosis not present

## 2019-03-10 DIAGNOSIS — Z3A37 37 weeks gestation of pregnancy: Secondary | ICD-10-CM

## 2019-03-10 DIAGNOSIS — O9912 Other diseases of the blood and blood-forming organs and certain disorders involving the immune mechanism complicating childbirth: Secondary | ICD-10-CM | POA: Diagnosis present

## 2019-03-10 DIAGNOSIS — O24420 Gestational diabetes mellitus in childbirth, diet controlled: Secondary | ICD-10-CM | POA: Diagnosis not present

## 2019-03-10 DIAGNOSIS — O133 Gestational [pregnancy-induced] hypertension without significant proteinuria, third trimester: Secondary | ICD-10-CM | POA: Diagnosis present

## 2019-03-10 DIAGNOSIS — O0990 Supervision of high risk pregnancy, unspecified, unspecified trimester: Secondary | ICD-10-CM

## 2019-03-10 DIAGNOSIS — O99119 Other diseases of the blood and blood-forming organs and certain disorders involving the immune mechanism complicating pregnancy, unspecified trimester: Secondary | ICD-10-CM | POA: Diagnosis present

## 2019-03-10 DIAGNOSIS — Z8759 Personal history of other complications of pregnancy, childbirth and the puerperium: Secondary | ICD-10-CM | POA: Diagnosis present

## 2019-03-10 HISTORY — DX: Depression, unspecified: F32.A

## 2019-03-10 LAB — COMPREHENSIVE METABOLIC PANEL
ALT: 17 U/L (ref 0–44)
AST: 17 U/L (ref 15–41)
Albumin: 2.8 g/dL — ABNORMAL LOW (ref 3.5–5.0)
Alkaline Phosphatase: 107 U/L (ref 38–126)
Anion gap: 8 (ref 5–15)
BUN: 6 mg/dL (ref 6–20)
CO2: 23 mmol/L (ref 22–32)
Calcium: 8.9 mg/dL (ref 8.9–10.3)
Chloride: 106 mmol/L (ref 98–111)
Creatinine, Ser: 0.49 mg/dL (ref 0.44–1.00)
GFR calc Af Amer: >60 mL/min (ref >60)
GFR calc non Af Amer: >60 mL/min (ref >60)
Glucose, Bld: 107 mg/dL — ABNORMAL HIGH (ref 70–99)
Potassium: 3.8 mmol/L (ref 3.5–5.1)
Sodium: 137 mmol/L (ref 135–145)
Total Bilirubin: 0.5 mg/dL (ref 0.3–1.2)
Total Protein: 6.5 g/dL (ref 6.5–8.1)

## 2019-03-10 LAB — CBC
HCT: 34.7 % — ABNORMAL LOW (ref 36.0–46.0)
HCT: 32.5 % — ABNORMAL LOW (ref 36.0–46.0)
Hemoglobin: 11.2 g/dL — ABNORMAL LOW (ref 12.0–15.0)
Hemoglobin: 10.9 g/dL — ABNORMAL LOW (ref 12.0–15.0)
MCH: 27.3 pg (ref 26.0–34.0)
MCH: 28.3 pg (ref 26.0–34.0)
MCHC: 32.3 g/dL (ref 30.0–36.0)
MCHC: 33.5 g/dL (ref 30.0–36.0)
MCV: 84.6 fL (ref 80.0–100.0)
MCV: 84.4 fL (ref 80.0–100.0)
Platelets: 111 10*3/uL — ABNORMAL LOW (ref 150–400)
Platelets: 133 10*3/uL — ABNORMAL LOW (ref 150–400)
RBC: 4.1 MIL/uL (ref 3.87–5.11)
RBC: 3.85 MIL/uL — ABNORMAL LOW (ref 3.87–5.11)
RDW: 12.5 % (ref 11.5–15.5)
RDW: 12.7 % (ref 11.5–15.5)
WBC: 7.9 10*3/uL (ref 4.0–10.5)
WBC: 10.8 10*3/uL — ABNORMAL HIGH (ref 4.0–10.5)
nRBC: 0 % (ref 0.0–0.2)
nRBC: 0 % (ref 0.0–0.2)

## 2019-03-10 LAB — PROTEIN / CREATININE RATIO, URINE
Creatinine, Urine: 115.67 mg/dL
Protein Creatinine Ratio: 0.19 mg/mg{Cre} — ABNORMAL HIGH (ref 0.00–0.15)
Total Protein, Urine: 22 mg/dL

## 2019-03-10 LAB — GLUCOSE, CAPILLARY
Glucose-Capillary: 99 mg/dL (ref 70–99)
Glucose-Capillary: 78 mg/dL (ref 70–99)
Glucose-Capillary: 80 mg/dL (ref 70–99)
Glucose-Capillary: 103 mg/dL — ABNORMAL HIGH (ref 70–99)

## 2019-03-10 LAB — TYPE AND SCREEN
ABO/RH(D): O POS
Antibody Screen: NEGATIVE

## 2019-03-10 LAB — RPR: RPR Ser Ql: NONREACTIVE

## 2019-03-10 LAB — ABO/RH: ABO/RH(D): O POS

## 2019-03-10 MED ORDER — FENTANYL-BUPIVACAINE-NACL 0.5-0.125-0.9 MG/250ML-% EP SOLN
12.0000 mL/h | EPIDURAL | Status: DC | PRN
  Filled 2019-03-10: qty 250

## 2019-03-10 MED ORDER — SOD CITRATE-CITRIC ACID 500-334 MG/5ML PO SOLN: 30.0000 mL | ORAL | Status: DC | PRN

## 2019-03-10 MED ORDER — OXYCODONE-ACETAMINOPHEN 5-325 MG PO TABS: 1.0000 | ORAL_TABLET | ORAL | Status: DC | PRN

## 2019-03-10 MED ORDER — LACTATED RINGERS IV SOLN: 500.0000 mL | Freq: Once | INTRAVENOUS | Status: DC

## 2019-03-10 MED ORDER — PHENYLEPHRINE 40 MCG/ML (10ML) SYRINGE FOR IV PUSH (FOR BLOOD PRESSURE SUPPORT): 80.0000 ug | PREFILLED_SYRINGE | INTRAVENOUS | Status: DC | PRN

## 2019-03-10 MED ORDER — TERBUTALINE SULFATE 1 MG/ML IJ SOLN: 0.2500 mg | Freq: Once | INTRAMUSCULAR | Status: DC | PRN

## 2019-03-10 MED ORDER — OXYTOCIN BOLUS FROM INFUSION
500.0000 mL | Freq: Once | INTRAVENOUS | Status: AC
  Administered 2019-03-12: 500 mL via INTRAVENOUS

## 2019-03-10 MED ORDER — PHENYLEPHRINE 40 MCG/ML (10ML) SYRINGE FOR IV PUSH (FOR BLOOD PRESSURE SUPPORT)
80.0000 ug | PREFILLED_SYRINGE | INTRAVENOUS | Status: DC | PRN
  Filled 2019-03-10: qty 10

## 2019-03-10 MED ORDER — OXYCODONE-ACETAMINOPHEN 5-325 MG PO TABS: 2.0000 | ORAL_TABLET | ORAL | Status: DC | PRN

## 2019-03-10 MED ORDER — MISOPROSTOL 25 MCG QUARTER TABLET
25.0000 ug | ORAL_TABLET | ORAL | Status: DC | PRN
  Administered 2019-03-10: 25 ug via VAGINAL
  Filled 2019-03-10: qty 1

## 2019-03-10 MED ORDER — LACTATED RINGERS IV SOLN: 500.0000 mL | INTRAVENOUS | Status: DC | PRN

## 2019-03-10 MED ORDER — ESCITALOPRAM OXALATE 10 MG PO TABS
10.0000 mg | ORAL_TABLET | Freq: Every day | ORAL | Status: DC
  Administered 2019-03-10: 10 mg via ORAL
  Filled 2019-03-10 (×2): qty 1

## 2019-03-10 MED ORDER — FENTANYL CITRATE (PF) 100 MCG/2ML IJ SOLN
100.0000 ug | INTRAMUSCULAR | Status: DC | PRN
  Administered 2019-03-10 – 2019-03-11 (×6): 100 ug via INTRAVENOUS
  Filled 2019-03-10 (×6): qty 2

## 2019-03-10 MED ORDER — LACTATED RINGERS IV SOLN
INTRAVENOUS | Status: DC
  Administered 2019-03-10 – 2019-03-11 (×6): via INTRAVENOUS

## 2019-03-10 MED ORDER — MISOPROSTOL 50MCG HALF TABLET
50.0000 ug | ORAL_TABLET | ORAL | Status: DC | PRN
  Administered 2019-03-10: 50 ug via BUCCAL
  Filled 2019-03-10: qty 1

## 2019-03-10 MED ORDER — ACETAMINOPHEN 325 MG PO TABS
650.0000 mg | ORAL_TABLET | ORAL | Status: DC | PRN
  Administered 2019-03-11 (×2): 650 mg via ORAL
  Filled 2019-03-10 (×2): qty 2

## 2019-03-10 MED ORDER — ONDANSETRON HCL 4 MG/2ML IJ SOLN
4.0000 mg | Freq: Four times a day (QID) | INTRAMUSCULAR | Status: DC | PRN
  Administered 2019-03-11 (×2): 4 mg via INTRAVENOUS
  Filled 2019-03-10 (×2): qty 2

## 2019-03-10 MED ORDER — EPHEDRINE 5 MG/ML INJ: 10.0000 mg | INTRAVENOUS | Status: DC | PRN

## 2019-03-10 MED ORDER — DIPHENHYDRAMINE HCL 50 MG/ML IJ SOLN: 12.5000 mg | INTRAMUSCULAR | Status: DC | PRN

## 2019-03-10 MED ORDER — LIDOCAINE HCL (PF) 1 % IJ SOLN: 30.0000 mL | INTRAMUSCULAR | Status: DC | PRN

## 2019-03-10 MED ORDER — OXYTOCIN 40 UNITS IN NORMAL SALINE INFUSION - SIMPLE MED
1.0000 m[IU]/min | INTRAVENOUS | Status: DC
  Administered 2019-03-10: 18:00:00 2 m[IU]/min via INTRAVENOUS
  Administered 2019-03-10: 10 m[IU]/min via INTRAVENOUS
  Administered 2019-03-11: 12 m[IU]/min via INTRAVENOUS
  Filled 2019-03-10: qty 1000

## 2019-03-10 MED ORDER — OXYTOCIN 40 UNITS IN NORMAL SALINE INFUSION - SIMPLE MED: 2.5000 [IU]/h | INTRAVENOUS | Status: DC

## 2019-03-10 NOTE — Progress Notes (Signed)
OB/[facility]N Faculty [facility]: Labor Progress Note  Subjective: Patient doing well. Just received IV fentanyl and pain has improved   Objective: BP (!) 144/93   Pulse 94   Temp 98.3 F (36.8 C) (Oral)   Resp 18   LMP 06/22/2018  Gen: lying comfortably in bed on left side. NAD  Dilation: 1 Effacement (%): Thick Cervical Position: Posterior Station: -3 Presentation: Vertex Exam by:: Dr. Earlene Plater  Assessment and Plan: 28 y.o. A7G8115 [redacted]w[redacted]d admitted for IOL for GHTN and A1GDM  Labor: Placed FB at 0904, which failed and replaced around 1030, and cytotec x 2 (1329). Will continue cytotec q 4 hours. -- pain control: IV fentanyl. Plans for epidural. -- PPH Risk: low  Fetal Well-Being: Baseline 130, mod variability, +a, -d -- Category I tracing - Continuous fetal monitoring  -- GBS neg  Genia Hotter, M.D.  Family Medicine  PGY-1 03/10/2019 3:46 PM

## 2019-03-10 NOTE — Progress Notes (Signed)
[facility]N Faculty [facility]: Labor Progress Note  Subjective: Doing well. Attempting to get comfortable on the bed.   Objective: BP 139/86   Pulse 87   Temp 98.7 F (37.1 C) (Oral)   Resp 18   LMP 06/22/2018  Gen: NAD. Lying on left side, adjusting position.  Dilation: 4 Effacement (%): 50 Cervical Position: Posterior Station: -3 Presentation: Vertex Exam by:: Jenness Corner, RN  Assessment and Plan: 28 y.o. [phone] [redacted]w[redacted]d IOL for GHTN and A1GDM  Labor: Foley bulb out at 4:30, s/p cytotec x 2 (1400). Continues to be high station. Will defer cervical exam. Pitocin 4 milliunites/min -- pain control: IV fentanyl -- PPH Risk: Low -- clears   Fetal Well-Being: 36+1 U/S w/ EFW 3056.  -- Category I tracing - Continuous fetal monitoring  -- GBS  negative  Genia Hotter, M.D.  Family Medicine  PGY-1 03/10/2019 7:09 PM

## 2019-03-10 NOTE — Progress Notes (Addendum)
Patient ID: Kathleen Patrick, female   DOB: 11/18/1990, 28 y.o.   MRN: [id number] Sleeping Has received 3 doses of Fentanyl  Vitals:   03/10/19 1938 03/10/19 2027 03/10/19 2051 03/10/19 2144  BP: 133/85 113/63 116/78 110/69  Pulse: 86 91 89 75  Resp:  17 16 15   Temp: 98.5 F (36.9 C)     TempSrc: Oral     SpO2:   98%    Fetal heart rate reactive, avg variability, no decels UCs every 2-3 min  Dilation: 4 Effacement (%): 50 Cervical Position: Posterior Station: -3 Presentation: Vertex Exam by:: Jenness Corner, RN  CBG 103  Will continue to observe

## 2019-03-10 NOTE — H&P (Signed)
LABOR AND DELIVERY ADMISSION HISTORY AND PHYSICAL NOTE  Kathleen Patrick is a 28 y.o. female [phone] with IUP at 59w2dby LMP c/w 8wk [place]ing for IOL for gHTN and A1GDM.  She reports positive fetal movement. She denies leakage of fluid or vaginal bleeding.  Prenatal History/Complications: PNC at [facility] Pregnancy complications:  - Gestational hypertension  - Gestational diabetes  - Gestational thrombocytopenia  - Hx of Depression, on Lexapro 1104m - Hx of PEC  - Hx of Trich, TOC negative  - Hx of PTD   Past Medical History: Past Medical History:  Diagnosis Date  . ADD (attention deficit disorder)   . Allergy   . Anemia   . Contraceptive management 06/08/2014  . Depression   . Dizzy 06/08/2014  . Gestational diabetes   . Irregular bleeding 12/01/2013  . Pregnancy   . Pregnancy induced hypertension   . RLQ abdominal pain 12/01/2013    Past Surgical History: Past Surgical History:  Procedure Laterality Date  . SUPERFICIAL LYMPH NODE BIOPSY / EXCISION      Obstetrical History: OB History    Gravida  4   Para  3   Term  2   Preterm  1   AB      Living  3     SAB      TAB      Ectopic      Multiple      Live Births  3           Social History: Social History   Socioeconomic History  . Marital status: Divorced    Spouse name: Not on file  . Number of children: 4  . Years of education: 1258. Highest education level: GED or equivalent  Occupational History  . Not on file  Social Needs  . Financial resource strain: Not very hard  . Food insecurity:    Worry: Never true    Inability: Never true  . Transportation needs:    Medical: No    Non-medical: No  Tobacco Use  . Smoking status: Former Smoker    Packs/day: 1.00    Types: Cigarettes    Last attempt to quit: 04/07/2012    Years since quitting: 6.9  . Smokeless tobacco: Never Used  Substance and Sexual Activity  . Alcohol use: No  . Drug use: No  . Sexual activity: Yes    Birth  control/protection: None  Lifestyle  . Physical activity:    Days per week: 5 days    Minutes per session: 150+ min  . Stress: To some extent  Relationships  . Social connections:    Talks on phone: More than three times a week    Gets together: More than three times a week    Attends religious service: Never    Active member of club or organization: No    Attends meetings of clubs or organizations: Never    Relationship status: Living with partner  Other Topics Concern  . Not on file  Social History Narrative  . Not on file    Family History: Family History  Problem Relation Age of Onset  . Hypertension Father   . Depression Maternal Grandmother   . Other Maternal Grandmother        commited suicide  . Other Daughter        MRSA  . Other Son        MRSA  . Alcohol abuse Maternal Grandfather   . Depression  Mother   . Cancer Other        maternal great grandma-lung,breast cancer; COPD  . Depression Maternal Aunt     Allergies: Allergies  Allergen Reactions  . Cefzil [Cefprozil] Hives    Medications Prior to Admission  Medication Sig Dispense Refill Last Dose  . Accu-Chek FastClix Lancets MISC 1 each by Percutaneous route 4 (four) times daily. 100 each 12 Taking  . aspirin EC 81 MG tablet Take 2 tablets (162 mg total) by mouth daily. 60 tablet 6 Taking  . Blood Glucose Monitoring Suppl (ACCU-CHEK GUIDE ME) w/Device KIT 1 each by Does not apply route 4 (four) times daily. 1 kit 0 Taking  . Blood Pressure Monitor MISC For regular home bp monitoring during pregnancy 1 each 0 Taking  . escitalopram (LEXAPRO) 10 MG tablet Take 1 tablet (10 mg total) by mouth daily. 30 tablet 6 Taking  . glucose blood (ACCU-CHEK GUIDE) test strip Use as instructed to check blood sugar 4 times daily 100 each 12 Taking  . omeprazole (PRILOSEC) 20 MG capsule Take 1 capsule (20 mg total) by mouth daily. 1 tablet a day 30 capsule 6 Taking  . ondansetron (ZOFRAN ODT) 4 MG disintegrating tablet  Take 1 tablet (4 mg total) by mouth every 6 (six) hours as needed for nausea. (Patient not taking: Reported on 03/02/2019) 30 tablet 2 Not Taking  . prenatal vitamin w/FE, FA (PRENATAL 1 + 1) 27-1 MG TABS tablet Take 1 tablet by mouth daily at 12 noon. 30 each 12 Taking     Review of Systems  All systems reviewed and negative except as stated in HPI  Physical Exam Blood pressure (!) 146/87, pulse (!) 114, last menstrual period 06/22/2018. General appearance: alert, cooperative and no distress Lungs: clear to auscultation bilaterally Heart: regular rate and rhythm Abdomen: soft, non-tender; bowel sounds normal Extremities: No calf swelling or tenderness Presentation: cephalic by bedside US  Fetal monitoring: 135/ moderate/ +accels / no decelerations  Uterine activity: occasional mild contractions  Dilation: 1 Effacement (%): Thick Station: -3 Exam by:: Rosana Hoes, RN   Prenatal labs: ABO, Rh: O/Positive/-- (12/18 0949) Antibody: Negative (03/16 0857) Rubella: 1.00 (12/18 0949) RPR: Non Reactive (03/16 0857)  HBsAg: Negative (12/18 0949)  HIV: Non Reactive (03/16 0857)  GC/Chlamydia: Negative  GBS:   Negative  2 hr Glucola: 102/211/146 Genetic screening:  Declined  Anatomy US: Normal female    FAMILY TREE  LAB RESULTS  Language English Pap 09/02/18: neg  Initiated care at 10wk GC/CT Initial:   -/-         36wks:  Dating by LMP c/w 8wk U/S     Support person Did not disclose Genetics NT/IT: 2nd IT not done   AFP: too late on 2/17  CfDNA: declined    /HgbE   Flu vaccine Declined 11/23/18  CF declined  TDaP vaccine declined SMA   Rhogam       Blood Type O/Positive/-- (12/18 0949)  Anatomy US Normal female '[place]' Antibody Negative (12/18 0949)  Feeding Plan bottle HBsAg Negative (12/18 0949)  Contraception BTL RPR Non Reactive (12/18 0949)  Circumcision n/a Rubella  1.00 (12/18 0949)  Pediatrician [facility] vs. [facility] HIV Non Reactive (12/18 0949)  Prenatal Classes  declined      GTT/A1C Early:      26-28wks: 80/121/96, Repeat: 102/211/146  BTL Consent  GBS        _0  PCN allergy  VBAC Consent n/a    Waterbirth _1 Class _2   Consent _0 CNM visit PP Needs      Prenatal Transfer Tool  Maternal Diabetes: No Genetic Screening: Declined Maternal Ultrasounds/Referrals: Normal Fetal Ultrasounds or other Referrals:  None Maternal Substance Abuse:  No Significant Maternal Medications:  None Significant Maternal Lab Results: Lab values include: Group B Strep negative  Results for orders placed or performed during the hospital encounter of 03/10/19 (from the past 24 hour(s))  Glucose, capillary   Collection Time: 03/10/19  8:12 AM  Result Value Ref Range   Glucose-Capillary 99 70 - 99 mg/dL    Patient Active Problem List   Diagnosis Date Noted  . Gestational hypertension, third trimester 03/10/2019  . Diet controlled White classification A1 gestational diabetes mellitus (GDM) 02/18/2019  . Gestational thrombocytopenia ([place]) 12/28/2018  . Supervision of high risk pregnancy, antepartum 09/02/2018  . Hx of preeclampsia, prior pregnancy, currently pregnant 09/02/2018  . History of preterm delivery, currently pregnant 09/02/2018  . Trichomonas infection 09/02/2018  . Depression 08/05/2018    Assessment: Arbie Reisz is a 28 y.o. [phone] at 78w2dhere for IOL for gHTN and A1GDM   #Labor: IOL with FB placed @ 0904 and vaginal cytotec  #Pain: Plans epidural, pain medication ordered PRN  #FWB: Cat I  #ID:  GBS neg  #MOF: Formula  #MOC: BTL - consent signed on 5/5 #Circ:  N/a   RLajean Manes CNM 03/10/2019, 9:13 AM

## 2019-03-11 ENCOUNTER — Other Ambulatory Visit: Payer: Self-pay

## 2019-03-11 ENCOUNTER — Inpatient Hospital Stay (HOSPITAL_COMMUNITY): Payer: Medicaid Other | Admitting: Anesthesiology

## 2019-03-11 ENCOUNTER — Encounter (HOSPITAL_COMMUNITY): Payer: Self-pay | Admitting: *Deleted

## 2019-03-11 LAB — CBC
HCT: 33.1 % — ABNORMAL LOW (ref 36.0–46.0)
HCT: 33.6 % — ABNORMAL LOW (ref 36.0–46.0)
HCT: 33.2 % — ABNORMAL LOW (ref 36.0–46.0)
Hemoglobin: 10.6 g/dL — ABNORMAL LOW (ref 12.0–15.0)
Hemoglobin: 11 g/dL — ABNORMAL LOW (ref 12.0–15.0)
Hemoglobin: 10.9 g/dL — ABNORMAL LOW (ref 12.0–15.0)
MCH: 27.1 pg (ref 26.0–34.0)
MCH: 27.7 pg (ref 26.0–34.0)
MCH: 27.5 pg (ref 26.0–34.0)
MCHC: 32 g/dL (ref 30.0–36.0)
MCHC: 32.7 g/dL (ref 30.0–36.0)
MCHC: 32.8 g/dL (ref 30.0–36.0)
MCV: 84.7 fL (ref 80.0–100.0)
MCV: 84.6 fL (ref 80.0–100.0)
MCV: 83.6 fL (ref 80.0–100.0)
Platelets: 107 10*3/uL — ABNORMAL LOW (ref 150–400)
Platelets: 117 10*3/uL — ABNORMAL LOW (ref 150–400)
Platelets: 121 10*3/uL — ABNORMAL LOW (ref 150–400)
RBC: 3.91 MIL/uL (ref 3.87–5.11)
RBC: 3.97 MIL/uL (ref 3.87–5.11)
RBC: 3.97 MIL/uL (ref 3.87–5.11)
RDW: 12.5 % (ref 11.5–15.5)
RDW: 12.6 % (ref 11.5–15.5)
RDW: 12.5 % (ref 11.5–15.5)
WBC: 9.9 10*3/uL (ref 4.0–10.5)
WBC: 11.5 10*3/uL — ABNORMAL HIGH (ref 4.0–10.5)
WBC: 11 10*3/uL — ABNORMAL HIGH (ref 4.0–10.5)
nRBC: 0 % (ref 0.0–0.2)
nRBC: 0 % (ref 0.0–0.2)
nRBC: 0 % (ref 0.0–0.2)

## 2019-03-11 LAB — GLUCOSE, CAPILLARY
Glucose-Capillary: 101 mg/dL — ABNORMAL HIGH (ref 70–99)
Glucose-Capillary: 97 mg/dL (ref 70–99)
Glucose-Capillary: 82 mg/dL (ref 70–99)
Glucose-Capillary: 81 mg/dL (ref 70–99)
Glucose-Capillary: 88 mg/dL (ref 70–99)
Glucose-Capillary: 84 mg/dL (ref 70–99)

## 2019-03-11 MED ORDER — SODIUM CHLORIDE (PF) 0.9 % IJ SOLN
INTRAMUSCULAR | Status: DC | PRN
  Administered 2019-03-11: 12 mL/h via EPIDURAL

## 2019-03-11 MED ORDER — LIDOCAINE HCL (PF) 1 % IJ SOLN
INTRAMUSCULAR | Status: DC | PRN
  Administered 2019-03-11 (×2): 4 mL via EPIDURAL

## 2019-03-11 NOTE — Anesthesia Procedure Notes (Signed)
Epidural Patient location during procedure: OB Start time: 03/11/2019 9:55 PM End time: 03/11/2019 10:10 PM  Staffing Anesthesiologist: Lewie Loron, MD Performed: anesthesiologist   Preanesthetic Checklist Completed: patient identified, pre-op evaluation, timeout performed, IV checked, risks and benefits discussed and monitors and equipment checked  Epidural Patient position: sitting Prep: site prepped and draped and DuraPrep Patient monitoring: heart rate, continuous pulse ox and blood pressure Approach: midline Location: L2-L3 Injection technique: LOR air and LOR saline  Needle:  Needle type: Tuohy  Needle gauge: 17 G Needle length: 9 cm Needle insertion depth: 7 cm Catheter type: closed end flexible Catheter size: 19 Gauge Catheter at skin depth: 10 cm Test dose: negative  Assessment Sensory level: T8 Events: blood not aspirated, injection not painful, no injection resistance, negative IV test and no paresthesia  Additional Notes Reason for block:procedure for pain

## 2019-03-11 NOTE — Progress Notes (Signed)
Kathleen Patrick is a 28 y.o. [phone] [id number] at 6952w3d.  Subjective: Very uncomfortable w/ contractions.   Objective: BP (!) 92/46   Pulse 74   Temp 98.3 F (36.8 C) (Oral)   Resp 15   LMP 06/22/2018   SpO2 98%    FHT:  FHR: 135 bpm, variability: Mod,  accelerations:  15x15,  decelerations:  no UC:   Q 2-3 minutes per pt, mod. Toco not tracing, monitors adjusted numerous times.  Dilation: 4 Effacement (%): 50 Cervical Position: Posterior Station: Ballotable Presentation: Vertex Exam by:: Ivonne AndrewV Broden Holt CNM  Labs: Results for orders placed or performed during the hospital encounter of 03/10/19 (from the past 24 hour(s))  Glucose, capillary     Status: None   Collection Time: 03/10/19 11:55 AM  Result Value Ref Range   Glucose-Capillary 78 70 - 99 mg/dL   Comment 1 Notify RN    Comment 2 Repeat Test    Comment 3 Document in Chart   Glucose, capillary     Status: None   Collection Time: 03/10/19  4:06 PM  Result Value Ref Range   Glucose-Capillary 80 70 - 99 mg/dL  CBC     Status: Abnormal   Collection Time: 03/10/19  6:24 PM  Result Value Ref Range   WBC 10.8 (H) 4.0 - 10.5 K/uL   RBC 3.85 (L) 3.87 - 5.11 MIL/uL   Hemoglobin 10.9 (L) 12.0 - 15.0 g/dL   HCT 13.032.5 (L) 86.536.0 - 78.446.0 %   MCV 84.4 80.0 - 100.0 fL   MCH 28.3 26.0 - 34.0 pg   MCHC 33.5 30.0 - 36.0 g/dL   RDW 69.612.7 29.511.5 - 28.415.5 %   Platelets 133 (L) 150 - 400 K/uL   nRBC 0.0 0.0 - 0.2 %  Comprehensive metabolic panel     Status: Abnormal   Collection Time: 03/10/19  7:33 PM  Result Value Ref Range   Sodium 137 135 - 145 mmol/L   Potassium 3.8 3.5 - 5.1 mmol/L   Chloride 106 98 - 111 mmol/L   CO2 23 22 - 32 mmol/L   Glucose, Bld 107 (H) 70 - 99 mg/dL   BUN 6 6 - 20 mg/dL   Creatinine, Ser 1.320.49 0.44 - 1.00 mg/dL   Calcium 8.9 8.9 - 44.010.3 mg/dL   Total Protein 6.5 6.5 - 8.1 g/dL   Albumin 2.8 (L) 3.5 - 5.0 g/dL   AST 17 15 - 41 U/L   ALT 17 0 - 44 U/L   Alkaline Phosphatase 107 38 - 126 U/L   Total Bilirubin 0.5 0.3  - 1.2 mg/dL   GFR calc non Af Amer >60 >60 mL/min   GFR calc Af Amer >60 >60 mL/min   Anion gap 8 5 - 15  Glucose, capillary     Status: Abnormal   Collection Time: 03/10/19  8:32 PM  Result Value Ref Range   Glucose-Capillary 103 (H) 70 - 99 mg/dL   Comment 1 Document in Chart   Protein / creatinine ratio, urine     Status: Abnormal   Collection Time: 03/10/19 10:52 PM  Result Value Ref Range   Creatinine, Urine 115.67 mg/dL   Total Protein, Urine 22 mg/dL   Protein Creatinine Ratio 0.19 (H) 0.00 - 0.15 mg/mg[Cre]  Glucose, capillary     Status: Abnormal   Collection Time: 03/11/19  1:24 AM  Result Value Ref Range   Glucose-Capillary 101 (H) 70 - 99 mg/dL   Comment 1 Notify RN   Glucose,  capillary     Status: None   Collection Time: 03/11/19  5:30 AM  Result Value Ref Range   Glucose-Capillary 97 70 - 99 mg/dL  CBC     Status: Abnormal   Collection Time: 03/11/19  7:43 AM  Result Value Ref Range   WBC 9.9 4.0 - 10.5 K/uL   RBC 3.91 3.87 - 5.11 MIL/uL   Hemoglobin 10.6 (L) 12.0 - 15.0 g/dL   HCT 10.2 (L) 11.1 - 73.5 %   MCV 84.7 80.0 - 100.0 fL   MCH 27.1 26.0 - 34.0 pg   MCHC 32.0 30.0 - 36.0 g/dL   RDW 67.0 14.1 - 03.0 %   Platelets 107 (L) 150 - 400 K/uL   nRBC 0.0 0.0 - 0.2 %  Glucose, capillary     Status: None   Collection Time: 03/11/19  9:44 AM  Result Value Ref Range   Glucose-Capillary 82 70 - 99 mg/dL    Assessment / Plan: [redacted]w[redacted]d week IUP Labor: IOL, not progressing. Baby too high to safely attempt AROM. Will do two hour pitocin break and restart at [facility].  Fetal Wellbeing:  Category I Pain Control:  Fentanyl Anticipated MOD:  SVD May ambulate, shower and have light laboring breakfast. NSL.   Katrinka Blazing, [place], CNM 03/11/2019 10:01 AM

## 2019-03-11 NOTE — Progress Notes (Signed)
Patient ID: Kathleen Patrick, female   DOB: July 22, 1991, 28 y.o.   MRN: [id number] Jasmie Andrews is a 28 y.o. [phone] at [redacted]w[redacted]d.  Subjective: Very uncomfortable w/ UC's   Objective: BP 123/80   Pulse 62   Temp 98.3 F (36.8 C) (Oral)   Resp 15   LMP 06/22/2018   SpO2 98%    FHT:  FHR: 135 bpm, variability: mod,  accelerations:  15x15,  decelerations:  none UC:   Q 3-4 minutes, moderate Dilation: 4 Effacement (%): 50 Cervical Position: Posterior Station: Ballotable Presentation: Vertex Exam by:: Ivonne Andrew CNM  Head to high to AROM and pt very intolerant of exam.   Labs: CBG 81  Assessment / Plan: [redacted]w[redacted]d week IUP Labor: Early/IOL, not progressing well. Will continue increasing pitocin and attempt AROM after pain meds and head better applied  Fetal Wellbeing:  Category I Pain Control:  Fentanyl Anticipated MOD:  SVD  Katrinka Blazing, [place], CNM 03/11/2019 6:00 PM

## 2019-03-11 NOTE — Progress Notes (Signed)
Patient ID: Kathleen Patrick, female   DOB: [birth year], 28 y.o.   MRN: [id number] Nauseated and has a headache RN just medicated her with Tylenol  . Vitals:   03/10/19 2051 03/10/19 2144 03/10/19 2221 03/10/19 2331  BP: 116/78 110/69 125/77 (!) 104/59  Pulse: 89 75 90 83  Resp: 16 15 16 15   Temp:   98.5 F (36.9 C)   TempSrc:   Oral   SpO2: 98%      FHR reactive UCs not tracing  WIll add Reglan, Benadryl and decadron if tylenol doesn't work

## 2019-03-11 NOTE — Progress Notes (Signed)
Running paper.  OBIX/EPIC down.

## 2019-03-11 NOTE — Progress Notes (Addendum)
Kathleen Patrick is a 28 y.o. [phone] at [redacted]w[redacted]d by LMP admitted for induction of labor due to Hypertension.  Subjective:   Objective: BP 127/79   Pulse 70   Temp 98.3 F (36.8 C) (Oral)   Resp 18   LMP 06/22/2018   SpO2 98%  No intake/output data recorded. No intake/output data recorded.  FHT:  FHR: 130 bpm, variability: moderate,  accelerations:  Present,  decelerations:  Absent UC:   regular, every 10 minutes SVE:   Dilation: 4.5 Effacement (%): 80 Station: -3 Exam by:: Dr. Shawnie Pons  AROM clear fluid  Labs: Lab Results  Component Value Date   WBC 11.5 (H) 03/11/2019   HGB 11.0 (L) 03/11/2019   HCT 33.6 (L) 03/11/2019   MCV 84.6 03/11/2019   PLT 117 (L) 03/11/2019    Assessment / Plan: Spontaneous labor, progressing normally  Labor: prolonged--now with AROM, hopeful for progression Preeclampsia:  no signs or symptoms of toxicity Fetal Wellbeing:  Category I Pain Control:  IV pain meds wants epidural. I/D:  n/a Anticipated MOD:  NSVD  Reva Bores 03/11/2019, 8:49 PM

## 2019-03-11 NOTE — Anesthesia Preprocedure Evaluation (Signed)
Anesthesia Evaluation  Patient identified by MRN, date of birth, ID band Patient awake    Reviewed: Allergy & Precautions, Patient's Chart, lab work & pertinent test results  Airway Mallampati: III  TM Distance: >3 FB Neck ROM: Full    Dental   Pulmonary former smoker,    Pulmonary exam normal breath sounds clear to auscultation       Cardiovascular hypertension, Normal cardiovascular exam Rhythm:Regular Rate:Normal     Neuro/Psych PSYCHIATRIC DISORDERS Depression negative neurological ROS     GI/Hepatic negative GI ROS, Neg liver ROS,   Endo/Other  diabetesMorbid obesity  Renal/GU negative Renal ROS     Musculoskeletal negative musculoskeletal ROS (+)   Abdominal (+) + obese,   Peds  Hematology  (+) Blood dyscrasia, anemia ,   Anesthesia Other Findings   Reproductive/Obstetrics (+) Pregnancy                             Anesthesia Physical Anesthesia Plan  ASA: III  Anesthesia Plan: Epidural   Post-op Pain Management:    Induction:   PONV Risk Score and Plan:   Airway Management Planned:   Additional Equipment:   Intra-op Plan:   Post-operative Plan:   Informed Consent: I have reviewed the patients History and Physical, chart, labs and discussed the procedure including the risks, benefits and alternatives for the proposed anesthesia with the patient or authorized representative who has indicated his/her understanding and acceptance.       Plan Discussed with:   Anesthesia Plan Comments:         Anesthesia Quick Evaluation

## 2019-03-11 NOTE — Progress Notes (Signed)
Patient ID: Lucil Knape, female   DOB: [birth year], 28 y.o.   MRN: [id number] Bettyann Pliler is a 28 y.o. [phone] at [redacted]w[redacted]d.  Subjective: Resting comfortably.   Objective: BP 120/69   Pulse 71   Temp 98.2 F (36.8 C) (Oral)   Resp 16   LMP 06/22/2018   SpO2 98%    FHT:  FHR: 145 bpm, variability: mod,  accelerations:  15x15,  decelerations:  None UC:   Irreg, mild-mod VE deferred. Vtx by BS [place].   Labs: NA  Assessment / Plan: [redacted]w[redacted]d week IUP Labor: IOL Fetal Wellbeing:  Category I Pain Control:  Fentanyl Anticipated MOD:  SVD Restart Pitocin.   Katrinka Blazing, [place], CNM 03/11/2019 1:25 PM

## 2019-03-12 ENCOUNTER — Encounter (HOSPITAL_COMMUNITY)
Admission: AD | Disposition: A | Discharge status: Home / Self Care | Payer: Self-pay | Source: Home / Self Care | Attending: Family Medicine

## 2019-03-12 ENCOUNTER — Inpatient Hospital Stay (HOSPITAL_COMMUNITY): Payer: Medicaid Other | Admitting: Anesthesiology

## 2019-03-12 ENCOUNTER — Encounter (HOSPITAL_COMMUNITY): Payer: Self-pay

## 2019-03-12 DIAGNOSIS — Z302 Encounter for sterilization: Secondary | ICD-10-CM

## 2019-03-12 HISTORY — PX: TUBAL LIGATION: SHX77

## 2019-03-12 LAB — CBC
HCT: 27.9 % — ABNORMAL LOW (ref 36.0–46.0)
Hemoglobin: 9 g/dL — ABNORMAL LOW (ref 12.0–15.0)
MCH: 27.7 pg (ref 26.0–34.0)
MCHC: 32.3 g/dL (ref 30.0–36.0)
MCV: 85.8 fL (ref 80.0–100.0)
Platelets: 95 10*3/uL — ABNORMAL LOW (ref 150–400)
RBC: 3.25 MIL/uL — ABNORMAL LOW (ref 3.87–5.11)
RDW: 12.7 % (ref 11.5–15.5)
WBC: 8.3 10*3/uL (ref 4.0–10.5)
nRBC: 0 % (ref 0.0–0.2)

## 2019-03-12 LAB — GLUCOSE, CAPILLARY
Glucose-Capillary: 115 mg/dL — ABNORMAL HIGH (ref 70–99)
Glucose-Capillary: 74 mg/dL (ref 70–99)

## 2019-03-12 SURGERY — LIGATION, FALLOPIAN TUBE, POSTPARTUM
Anesthesia: Epidural | Site: Abdomen | Wound class: Clean Contaminated

## 2019-03-12 MED ORDER — BUPIVACAINE HCL (PF) 0.5 % IJ SOLN
INTRAMUSCULAR | Status: AC
  Filled 2019-03-12: qty 30

## 2019-03-12 MED ORDER — PRENATAL MULTIVITAMIN CH
1.0000 | ORAL_TABLET | Freq: Every day | ORAL | Status: DC
  Administered 2019-03-12 – 2019-03-13 (×2): 1 via ORAL
  Filled 2019-03-12 (×2): qty 1

## 2019-03-12 MED ORDER — NALBUPHINE HCL 10 MG/ML IJ SOLN: 5.0000 mg | INTRAMUSCULAR | Status: DC | PRN

## 2019-03-12 MED ORDER — COCONUT OIL OIL: 1.0000 "application " | TOPICAL_OIL | Status: DC | PRN

## 2019-03-12 MED ORDER — MIDAZOLAM HCL 2 MG/2ML IJ SOLN
INTRAMUSCULAR | Status: AC
  Filled 2019-03-12: qty 2

## 2019-03-12 MED ORDER — KETOROLAC TROMETHAMINE 30 MG/ML IJ SOLN: 30.0000 mg | Freq: Four times a day (QID) | INTRAMUSCULAR | Status: AC | PRN

## 2019-03-12 MED ORDER — MEPERIDINE HCL 25 MG/ML IJ SOLN: 6.2500 mg | INTRAMUSCULAR | Status: DC | PRN

## 2019-03-12 MED ORDER — DIPHENHYDRAMINE HCL 25 MG PO CAPS: 25.0000 mg | ORAL_CAPSULE | Freq: Four times a day (QID) | ORAL | Status: DC | PRN

## 2019-03-12 MED ORDER — ACETAMINOPHEN 10 MG/ML IV SOLN: 1000.0000 mg | Freq: Once | INTRAVENOUS | Status: DC | PRN

## 2019-03-12 MED ORDER — BUPIVACAINE HCL 0.5 % IJ SOLN
INTRAMUSCULAR | Status: DC | PRN
  Administered 2019-03-12: 30 mL

## 2019-03-12 MED ORDER — SODIUM BICARBONATE 8.4 % IV SOLN
INTRAVENOUS | Status: AC
  Filled 2019-03-12: qty 50

## 2019-03-12 MED ORDER — NALOXONE HCL 4 MG/10ML IJ SOLN
1.0000 ug/kg/h | INTRAVENOUS | Status: DC | PRN
  Filled 2019-03-12: qty 5

## 2019-03-12 MED ORDER — SODIUM BICARBONATE 8.4 % IV SOLN: INTRAVENOUS | Status: DC | PRN

## 2019-03-12 MED ORDER — SODIUM CHLORIDE 0.9 % IR SOLN
Status: DC | PRN
  Administered 2019-03-12: 1

## 2019-03-12 MED ORDER — METOCLOPRAMIDE HCL 10 MG PO TABS
10.0000 mg | ORAL_TABLET | Freq: Once | ORAL | Status: AC
  Administered 2019-03-12: 10 mg via ORAL
  Filled 2019-03-12: qty 1

## 2019-03-12 MED ORDER — TETANUS-DIPHTH-ACELL PERTUSSIS 5-2.5-18.5 LF-MCG/0.5 IM SUSP: 0.5000 mL | Freq: Once | INTRAMUSCULAR | Status: DC

## 2019-03-12 MED ORDER — ACETAMINOPHEN 160 MG/5ML PO SOLN: 325.0000 mg | Freq: Once | ORAL | Status: DC | PRN

## 2019-03-12 MED ORDER — DIPHENHYDRAMINE HCL 25 MG PO CAPS: 25.0000 mg | ORAL_CAPSULE | ORAL | Status: DC | PRN

## 2019-03-12 MED ORDER — DIBUCAINE (PERIANAL) 1 % EX OINT: 1.0000 "application " | TOPICAL_OINTMENT | CUTANEOUS | Status: DC | PRN

## 2019-03-12 MED ORDER — ONDANSETRON HCL 4 MG/2ML IJ SOLN: 4.0000 mg | Freq: Three times a day (TID) | INTRAMUSCULAR | Status: DC | PRN

## 2019-03-12 MED ORDER — PROPOFOL 10 MG/ML IV BOLUS
INTRAVENOUS | Status: AC
  Filled 2019-03-12: qty 40

## 2019-03-12 MED ORDER — DIPHENHYDRAMINE HCL 50 MG/ML IJ SOLN: 12.5000 mg | INTRAMUSCULAR | Status: DC | PRN

## 2019-03-12 MED ORDER — SENNOSIDES-DOCUSATE SODIUM 8.6-50 MG PO TABS
2.0000 | ORAL_TABLET | ORAL | Status: DC
  Administered 2019-03-12: 2 via ORAL
  Filled 2019-03-12: qty 2

## 2019-03-12 MED ORDER — FENTANYL CITRATE (PF) 100 MCG/2ML IJ SOLN
INTRAMUSCULAR | Status: AC
  Filled 2019-03-12: qty 2

## 2019-03-12 MED ORDER — ONDANSETRON HCL 4 MG/2ML IJ SOLN
INTRAMUSCULAR | Status: DC | PRN
  Administered 2019-03-12: 4 mg via INTRAVENOUS

## 2019-03-12 MED ORDER — ONDANSETRON HCL 4 MG/2ML IJ SOLN: 4.0000 mg | INTRAMUSCULAR | Status: DC | PRN

## 2019-03-12 MED ORDER — MIDAZOLAM HCL 5 MG/5ML IJ SOLN
INTRAMUSCULAR | Status: DC | PRN
  Administered 2019-03-12 (×4): 1 mg via INTRAVENOUS

## 2019-03-12 MED ORDER — NALOXONE HCL 0.4 MG/ML IJ SOLN: 0.4000 mg | INTRAMUSCULAR | Status: DC | PRN

## 2019-03-12 MED ORDER — NALBUPHINE HCL 10 MG/ML IJ SOLN: 5.0000 mg | Freq: Once | INTRAMUSCULAR | Status: DC | PRN

## 2019-03-12 MED ORDER — ACETAMINOPHEN 325 MG PO TABS
650.0000 mg | ORAL_TABLET | ORAL | Status: DC | PRN
  Administered 2019-03-12 – 2019-03-13 (×5): 650 mg via ORAL
  Filled 2019-03-12 (×5): qty 2

## 2019-03-12 MED ORDER — ONDANSETRON HCL 4 MG/2ML IJ SOLN
INTRAMUSCULAR | Status: AC
  Filled 2019-03-12: qty 2

## 2019-03-12 MED ORDER — WITCH HAZEL-GLYCERIN EX PADS: 1.0000 "application " | MEDICATED_PAD | CUTANEOUS | Status: DC | PRN

## 2019-03-12 MED ORDER — SIMETHICONE 80 MG PO CHEW
80.0000 mg | CHEWABLE_TABLET | ORAL | Status: DC | PRN
  Administered 2019-03-12: 21:00:00 80 mg via ORAL
  Filled 2019-03-12: qty 1

## 2019-03-12 MED ORDER — KETOROLAC TROMETHAMINE 30 MG/ML IJ SOLN
30.0000 mg | Freq: Four times a day (QID) | INTRAMUSCULAR | Status: AC | PRN
  Administered 2019-03-12: 30 mg via INTRAVENOUS
  Filled 2019-03-12: qty 1

## 2019-03-12 MED ORDER — FAMOTIDINE 20 MG PO TABS
40.0000 mg | ORAL_TABLET | Freq: Once | ORAL | Status: AC
  Administered 2019-03-12: 40 mg via ORAL
  Filled 2019-03-12: qty 2

## 2019-03-12 MED ORDER — ACETAMINOPHEN 325 MG PO TABS: 325.0000 mg | ORAL_TABLET | Freq: Once | ORAL | Status: DC | PRN

## 2019-03-12 MED ORDER — SCOPOLAMINE 1 MG/3DAYS TD PT72: 1.0000 | MEDICATED_PATCH | Freq: Once | TRANSDERMAL | Status: DC

## 2019-03-12 MED ORDER — ONDANSETRON HCL 4 MG PO TABS: 4.0000 mg | ORAL_TABLET | ORAL | Status: DC | PRN

## 2019-03-12 MED ORDER — SODIUM CHLORIDE 0.9% FLUSH: 3.0000 mL | INTRAVENOUS | Status: DC | PRN

## 2019-03-12 MED ORDER — PROPOFOL 500 MG/50ML IV EMUL
INTRAVENOUS | Status: DC | PRN
  Administered 2019-03-12: 75 ug/kg/min via INTRAVENOUS

## 2019-03-12 MED ORDER — ZOLPIDEM TARTRATE 5 MG PO TABS: 5.0000 mg | ORAL_TABLET | Freq: Every evening | ORAL | Status: DC | PRN

## 2019-03-12 MED ORDER — PROMETHAZINE HCL 25 MG/ML IJ SOLN: 6.2500 mg | INTRAMUSCULAR | Status: DC | PRN

## 2019-03-12 MED ORDER — OXYCODONE HCL 5 MG PO TABS: 5.0000 mg | ORAL_TABLET | ORAL | Status: DC | PRN

## 2019-03-12 MED ORDER — SODIUM BICARBONATE 8.4 % IV SOLN
INTRAVENOUS | Status: DC | PRN
  Administered 2019-03-12: 5 mL via EPIDURAL
  Administered 2019-03-12: 3 mL via EPIDURAL
  Administered 2019-03-12: 2 mL via EPIDURAL
  Administered 2019-03-12: 7 mL via EPIDURAL

## 2019-03-12 MED ORDER — LACTATED RINGERS IV SOLN
INTRAVENOUS | Status: DC
  Administered 2019-03-12 (×3): via INTRAVENOUS

## 2019-03-12 MED ORDER — FENTANYL CITRATE (PF) 100 MCG/2ML IJ SOLN: 25.0000 ug | INTRAMUSCULAR | Status: DC | PRN

## 2019-03-12 MED ORDER — LIDOCAINE-EPINEPHRINE (PF) 2 %-1:200000 IJ SOLN
INTRAMUSCULAR | Status: AC
  Filled 2019-03-12: qty 10

## 2019-03-12 MED ORDER — BENZOCAINE-MENTHOL 20-0.5 % EX AERO: 1.0000 "application " | INHALATION_SPRAY | CUTANEOUS | Status: DC | PRN

## 2019-03-12 MED ORDER — IBUPROFEN 600 MG PO TABS
600.0000 mg | ORAL_TABLET | Freq: Four times a day (QID) | ORAL | Status: DC
  Administered 2019-03-12 – 2019-03-13 (×2): 600 mg via ORAL
  Filled 2019-03-12 (×3): qty 1

## 2019-03-12 MED ORDER — FENTANYL CITRATE (PF) 100 MCG/2ML IJ SOLN
INTRAMUSCULAR | Status: DC | PRN
  Administered 2019-03-12: 50 ug via INTRAVENOUS
  Administered 2019-03-12: 50 ug via EPIDURAL
  Administered 2019-03-12 (×2): 50 ug via INTRAVENOUS

## 2019-03-12 MED ORDER — OXYCODONE HCL 5 MG PO TABS
10.0000 mg | ORAL_TABLET | ORAL | Status: DC | PRN
  Administered 2019-03-12 – 2019-03-13 (×6): 10 mg via ORAL
  Filled 2019-03-12 (×6): qty 2

## 2019-03-12 MED ORDER — LACTATED RINGERS IV SOLN: INTRAVENOUS | Status: DC

## 2019-03-12 MED ORDER — PROPOFOL 10 MG/ML IV BOLUS
INTRAVENOUS | Status: DC | PRN
  Administered 2019-03-12: 20 mg via INTRAVENOUS
  Administered 2019-03-12: 30 mg via INTRAVENOUS
  Administered 2019-03-12: 20 mg via INTRAVENOUS
  Administered 2019-03-12 (×3): 10 mg via INTRAVENOUS

## 2019-03-12 SURGICAL SUPPLY — 29 items
CLIP FILSHIE TUBAL LIGA STRL (Clip) ×1 IMPLANT
CLOTH BEACON ORANGE TIMEOUT ST (SAFETY) ×2 IMPLANT
DERMABOND ADVANCED (GAUZE/BANDAGES/DRESSINGS) ×1
DERMABOND ADVANCED .7 DNX12 (GAUZE/BANDAGES/DRESSINGS) ×1 IMPLANT
DRESSING OPSITE X SMALL 2X3 (GAUZE/BANDAGES/DRESSINGS) ×1 IMPLANT
DRSG OPSITE POSTOP 3X4 (GAUZE/BANDAGES/DRESSINGS) ×2 IMPLANT
DURAPREP 26ML APPLICATOR (WOUND CARE) ×2 IMPLANT
ELECT REM PT RETURN 9FT ADLT (ELECTROSURGICAL) ×2
ELECTRODE REM PT RTRN 9FT ADLT (ELECTROSURGICAL) ×1 IMPLANT
GLOVE BIO SURGEON STRL SZ7 (GLOVE) ×2 IMPLANT
GLOVE BIOGEL PI IND STRL 7.0 (GLOVE) ×1 IMPLANT
GLOVE BIOGEL PI IND STRL 7.5 (GLOVE) ×1 IMPLANT
GLOVE BIOGEL PI INDICATOR 7.0 (GLOVE) ×1
GLOVE BIOGEL PI INDICATOR 7.5 (GLOVE) ×1
GOWN STRL REUS W/TWL LRG LVL3 (GOWN DISPOSABLE) ×4 IMPLANT
NEEDLE HYPO 22GX1.5 SAFETY (NEEDLE) ×2 IMPLANT
NS IRRIG 1000ML POUR BTL (IV SOLUTION) ×2 IMPLANT
PACK ABDOMINAL MINOR (CUSTOM PROCEDURE TRAY) ×2 IMPLANT
PENCIL BUTTON HOLSTER BLD 10FT (ELECTRODE) IMPLANT
PROTECTOR NERVE ULNAR (MISCELLANEOUS) ×2 IMPLANT
SPONGE LAP 4X18 RFD (DISPOSABLE) ×2 IMPLANT
SUT MNCRL AB 4-0 PS2 18 (SUTURE) ×2 IMPLANT
SUT PLAIN 2 0 (SUTURE) ×1
SUT PLAIN ABS 2-0 CT1 27XMFL (SUTURE) IMPLANT
SUT VICRYL 0 TIES 12 18 (SUTURE) IMPLANT
SUT VICRYL 0 UR6 27IN ABS (SUTURE) ×2 IMPLANT
SYR CONTROL 10ML LL (SYRINGE) ×2 IMPLANT
TOWEL OR 17X24 6PK STRL BLUE (TOWEL DISPOSABLE) ×4 IMPLANT
TRAY FOLEY CATH SILVER 14FR (SET/KITS/TRAYS/PACK) ×2 IMPLANT

## 2019-03-12 NOTE — Discharge Summary (Signed)
Postpartum Discharge Summary     Patient Name: Kathleen Patrick DOB: [birth year] MRN: [id number]  Date of admission: 03/10/2019 Delivering Provider: Renee Harder   Date of discharge: 03/13/2019  Admitting diagnosis: pregnancy Intrauterine pregnancy: [redacted]w[redacted]d    Secondary diagnosis:  Active Problems:   Depression   Hx of preeclampsia, prior pregnancy, currently pregnant   [place]   Diet controlled White classification A1 gestational diabetes mellitus (GDM)   Gestational hypertension, third trimester  Additional problems: none     Discharge diagnosis: Term Pregnancy Delivered, Gestational Hypertension, GDM A1 and gest thrombocytopenia                                                                                                Post partum procedures:postpartum tubal ligation on PPD#0  Augmentation: AROM, Pitocin, Cytotec and Foley Balloon  Complications: None  Hospital course:  Induction of Labor With Vaginal Delivery   28y.o. yo [id number] admitted to the hospital 03/10/2019 for induction of labor.  Indication for induction: Gestational hypertension. Her pre-e labs were neg, but plts were remarkable for gest thrombocytopenia which has been present during the pregnancy. Patient had a labor course remarkable for the usual cx ripening methods leading to SVD at [place] 03/12/19. Membrane Rupture Time/Date: 8:38 PM ,03/11/2019   Intrapartum Procedures: Episiotomy: None [1]                                         Lacerations:  None [1]  Patient had delivery of a Viable infant.  Information for the patient's newborn:  LoSanii, Kukla0[[id number]]Delivery Method: Vaginal, Spontaneous(Filed from Delivery Summary)   03/12/2019  Details of delivery can be found in separate delivery note.  Patient had a postpartum course remarkable for a BTL on PPD#0 which she tolerated well. She remained normotensive without medication during her PP stay.  Patient is discharged home 03/13/19 per her request for early d/c.  Magnesium Sulfate recieved: No BMZ received: Yes  Physical exam  Vitals:   03/12/19 1700 03/12/19 2121 03/13/19 0033 03/13/19 0500  BP: 132/85 137/83 113/76 117/80  Pulse: 84 92 87 88  Resp: '20 20 18   ' Temp: 98.4 F (36.9 C) 99 F (37.2 C) 98.3 F (36.8 C) 98.7 F (37.1 C)  TempSrc:  Oral Oral Oral  SpO2: 98% 100% 100% 99%   General: alert and cooperative Lochia: appropriate Uterine Fundus: firm Incision: umbilical incision with honeycomb intact and dry DVT Evaluation: No evidence of DVT seen on physical exam. Labs: Lab Results  Component Value Date   WBC 8.3 03/12/2019   HGB 9.0 (L) 03/12/2019   HCT 27.9 (L) 03/12/2019   MCV 85.8 03/12/2019   PLT 95 (L) 03/12/2019   CMP Latest Ref Rng & Units 03/10/2019  Glucose 70 - 99 mg/dL 107(H)  BUN 6 - 20 mg/dL 6  Creatinine 0.44 - 1.00 mg/dL 0.49  Sodium 135 - 145 mmol/L 137  Potassium 3.5 - 5.1  mmol/L 3.8  Chloride 98 - 111 mmol/L 106  CO2 22 - 32 mmol/L 23  Calcium 8.9 - 10.3 mg/dL 8.9  Total Protein 6.5 - 8.1 g/dL 6.5  Total Bilirubin 0.3 - 1.2 mg/dL 0.5  Alkaline Phos 38 - 126 U/L 107  AST 15 - 41 U/L 17  ALT 0 - 44 U/L 17    Discharge instruction: per After Visit Summary and "Baby and Me Booklet".  After visit meds:  Allergies as of 03/13/2019      Reactions   Cefzil [cefprozil] Hives      Medication List    STOP taking these medications   Accu-Chek FastClix Lancets Misc   Accu-Chek Guide Me w/Device Kit   Blood Pressure Monitor Misc   glucose blood test strip Commonly known as:  Accu-Chek Guide   omeprazole 20 MG capsule Commonly known as:  PRILOSEC   ondansetron 4 MG disintegrating tablet Commonly known as:  Zofran ODT     TAKE these medications   aspirin EC 81 MG tablet Take 2 tablets (162 mg total) by mouth daily. What changed:  how much to take   escitalopram 10 MG tablet Commonly known as:  LEXAPRO Take 1 tablet (10 mg  total) by mouth daily.   ibuprofen 600 MG tablet Commonly known as:  ADVIL Take 1 tablet (600 mg total) by mouth every 6 (six) hours as needed.   oxyCODONE 5 MG immediate release tablet Commonly known as:  Oxy IR/ROXICODONE Take 1 tablet (5 mg total) by mouth every 4 (four) hours as needed (pain scale 4-7).   prenatal vitamin w/FE, FA 27-1 MG Tabs tablet Take 1 tablet by mouth daily at 12 noon.       Diet: routine diet  Activity: Advance as tolerated. Pelvic rest for 6 weeks.   Outpatient follow up:1-2 wks for BP check; 6wks for GTT Follow up Appt: Future Appointments  Date Time Provider [place]  04/08/2019 11:15 AM Cresenzo-Dishmon, Joaquim Lai, CNM [facility]   Follow up Visit:   Please schedule this patient for Postpartum visit in: 4 weeks with the following provider: Any provider For C/S patients schedule nurse incision check in weeks 2 weeks: no High risk pregnancy complicated by: HTN Delivery mode:  SVD Anticipated Birth Control:  BTL done PP  PP Procedures needed: BP check 1 week  Schedule Integrated BH visit: no      Newborn Data: Live born female  Birth Weight: 7 lb 1.4 oz (3215 g) APGAR: 6, 8  Newborn Delivery   Birth date/time:  03/12/2019 00:14:00 Delivery type:  Vaginal, Spontaneous     Baby Feeding: Bottle Disposition:home with mother   03/13/2019 Myrtis Ser, CNM  9:01 AM

## 2019-03-12 NOTE — Anesthesia Postprocedure Evaluation (Signed)
Anesthesia Post Note  Patient: Kathleen Patrick  Procedure(s) Performed: AN AD HOC LABOR EPIDURAL     Patient location during evaluation: Mother Baby Anesthesia Type: Epidural Level of consciousness: awake and alert Pain management: pain level controlled Vital Signs Assessment: post-procedure vital signs reviewed and stable Respiratory status: spontaneous breathing, nonlabored ventilation and respiratory function stable Cardiovascular status: stable Postop Assessment: no headache, no backache and epidural receding Anesthetic complications: no    Last Vitals:  Vitals:   03/12/19 0240 03/12/19 0333  BP: 137/73 125/72  Pulse: 88 79  Resp: 17 16  Temp: 37.1 C 37.3 C  SpO2: 100% 99%    Last Pain:  Vitals:   03/12/19 0333  TempSrc: Oral  PainSc: 0-No pain   Pain Goal:                   Calab Sachse

## 2019-03-12 NOTE — Anesthesia Preprocedure Evaluation (Addendum)
Anesthesia Evaluation  Patient identified by MRN, date of birth, ID band Patient awake    Reviewed: Allergy & Precautions, NPO status , Patient's Chart, lab work & pertinent test results  Airway Mallampati: I  TM Distance: >3 FB Neck ROM: Full    Dental  (+) Teeth Intact, Dental Advisory Given   Pulmonary former smoker,    Pulmonary exam normal        Cardiovascular hypertension,  Rhythm:Regular Rate:Normal     Neuro/Psych Depression negative neurological ROS     GI/Hepatic Neg liver ROS, GERD  Medicated,  Endo/Other  diabetes  Renal/GU negative Renal ROS     Musculoskeletal negative musculoskeletal ROS (+)   Abdominal (+) + obese,   Peds  Hematology negative hematology ROS (+)   Anesthesia Other Findings   Reproductive/Obstetrics                            Anesthesia Physical Anesthesia Plan  ASA: III  Anesthesia Plan: Epidural   Post-op Pain Management:    Induction: Intravenous  PONV Risk Score and Plan: 2 and Ondansetron and Dexamethasone  Airway Management Planned: Simple Face Mask and Natural Airway  Additional Equipment: None  Intra-op Plan:   Post-operative Plan:   Informed Consent: I have reviewed the patients History and Physical, chart, labs and discussed the procedure including the risks, benefits and alternatives for the proposed anesthesia with the patient or authorized representative who has indicated his/her understanding and acceptance.       Plan Discussed with: CRNA  Anesthesia Plan Comments:      Anesthesia Quick Evaluation

## 2019-03-12 NOTE — Anesthesia Postprocedure Evaluation (Signed)
Anesthesia Post Note  Patient: Joa Maddux  Procedure(s) Performed: POST PARTUM TUBAL LIGATION (N/A Abdomen)     Patient location during evaluation: PACU Anesthesia Type: Epidural Level of consciousness: oriented and awake and alert Pain management: pain level controlled Vital Signs Assessment: post-procedure vital signs reviewed and stable Respiratory status: spontaneous breathing, respiratory function stable and patient connected to nasal cannula oxygen Cardiovascular status: blood pressure returned to baseline and stable Postop Assessment: no headache, no backache, no apparent nausea or vomiting and epidural receding Anesthetic complications: no    Last Vitals:  Vitals:   03/12/19 1230 03/12/19 1250  BP: 122/67 129/67  Pulse: 69 60  Resp: 14 18  Temp: 36.8 C 36.8 C  SpO2: 100% 100%    Last Pain:  Vitals:   03/12/19 1255  TempSrc:   PainSc: 4    Pain Goal:                   Shelton Silvas

## 2019-03-12 NOTE — Transfer of Care (Signed)
Immediate Anesthesia Transfer of Care Note  Patient: Kathleen Patrick  Procedure(s) Performed: POST PARTUM TUBAL LIGATION (N/A Abdomen)  Patient Location: PACU  Anesthesia Type:Epidural  Level of Consciousness: awake, alert  and oriented  Airway & Oxygen Therapy: Patient Spontanous Breathing  Post-op Assessment: Report given to RN and Post -op Vital signs reviewed and stable  Post vital signs: Reviewed and stable  Last Vitals:  Vitals Value Taken Time  BP 115/67 03/12/2019 11:33 AM  Temp    Pulse 93 03/12/2019 11:37 AM  Resp 11 03/12/2019 11:37 AM  SpO2 100 % 03/12/2019 11:37 AM  Vitals shown include unvalidated device data.  Last Pain:  Vitals:   03/12/19 0845  TempSrc:   PainSc: 2          Complications: No apparent anesthesia complications

## 2019-03-12 NOTE — Progress Notes (Signed)
ppBTL d/w mom. R/b/a d/w her and she desires BTL. BTL papers can be fixed per Peggyann Shoals so okay for ppBTL today. Patient has been NPO. Can proceed when OR is done  Cornelia Copa MD Attending Center for [employer] (Faculty Practice) 03/12/2019 Time: [id number]

## 2019-03-12 NOTE — Op Note (Signed)
Kathleen Patrick 03/10/2019 - 03/12/2019  PREOPERATIVE DIAGNOSIS:  Undesired fertility POSTOPERATIVE DIAGNOSIS:  Undesired fertility  PROCEDURE:  Postpartum Bilateral Tubal Sterilization using Filshie Clips   SURGEON: Surgeon(s) and Role:    * Maunabo Bing, MD - Primary    * Tamera Stands, DO - Fellow   ANESTHESIA:  Epidural COMPLICATIONS:  None immediate. ESTIMATED BLOOD LOSS:  Minimal FLUIDS: 1300 cc LR.  URINE OUTPUT:  In/out catheter to be placed at completion of procedure  INDICATIONS: 28 y.o. yo [id number]  with undesired fertility,status post vaginal delivery, desires permanent sterilization. Risks and benefits of procedure discussed with patient including permanence of method, bleeding, infection, injury to surrounding organs and need for additional procedures. Risk failure of 0.5-1% with increased risk of ectopic gestation if pregnancy occurs was also discussed with patient.   FINDINGS:  Normal uterus, tubes, and ovaries.  TECHNIQUE:  The patient was taken to the operating room where her epidural anesthesia was dosed up to surgical level and found to be adequate.  She was then placed in the dorsal supine position and prepped and draped in sterile fashion.  After an adequate timeout was performed, attention was turned to the patient's abdomen where 0.25% marcaine was injected intradermally and then a small transverse skin incision was made under the umbilical fold. The incision was taken down to the layer of fascia using Metz combined with blunt dissection. Fascia was incised, and extended bilaterally using Mayo scissors. The peritoneum was entered in a sharp fashion. Attention was then turned to the patient's uterus, and left fallopian tube was identified and followed out to the fimbriated end.  A Filshie clip was placed on the left fallopian tube about 2 cm from the cornual attachment, with care given to incorporate the underlying mesosalpinx. The patient had inadequate pain  control, so the procedure was paused momentarily for additional analgesia. Local marcaine was injected into the fascia on the left, as this was the area of increased pain. A similar process was carried out on the right side allowing for bilateral tubal sterilization.  Good hemostasis was noted overall.  The instruments were then removed from the patient's abdomen and the fascial incision was repaired with 0-Vicryl, and a figure-of-eight stitch was placed to close the subcutaneous layer. The skin was closed with a 4-0 Monocryl subcuticular stitch. The patient tolerated the procedure well.  Sponge, lap, and needle counts were correct times two.  The patient was then taken to the recovery room awake, and in stable condition.  Cristal Deer. Earlene Plater, DO OB/GYN Fellow

## 2019-03-12 NOTE — Progress Notes (Signed)
Comfortable w/epidural.  FHR Cat 1. Ctx q 2-3 minutes.  Cx 7/100/-1.  Pitocin at 24 mu/min.  Vitals:   03/11/19 2310 03/11/19 2329  BP: 103/60 130/85  Pulse: 77 82  Resp: 18 18  Temp:    SpO2:     Making good progress. Anticipate SVD.

## 2019-03-12 NOTE — Progress Notes (Addendum)
Daily Post Partum Note  03/12/2019 Kathleen Patrick is a 28 y.o. [id number] PPD#1 s/p  SVD/intact perineum  @ 848w4d.  Pregnancy c/b GDMa1, h/o trich (neg toc), gestational thrombocytopenia, h/o pre-eclampsia  IOL for gHTN 24hr/overnight events:  none  Subjective:  Npo since MN. Meeting all PP goals  Objective:   Vitals:   03/12/19 0100 03/12/19 0115 03/12/19 0240 03/12/19 0333  BP: 116/66 116/69 137/73 125/72  Pulse: 73 81 88 79  Resp:   17 16  Temp:  98.2 F (36.8 C) 98.7 F (37.1 C) 99.1 F (37.3 C)  TempSrc:  Oral Oral Oral  SpO2:   100% 99%     Current Vital Signs 24h Vital Sign Ranges  T 99.1 F (37.3 C) Temp  Avg: 98.5 F (36.9 C)  Min: 98.2 F (36.8 C)  Max: 99.1 F (37.3 C)  BP 125/72 BP  Min: 92/46  Max: 144/88  HR 79 Pulse  Avg: 83.3  Min: 62  Max: 265  RR 16 Resp  Avg: 16.9  Min: 15  Max: 20  SaO2 99 %   SpO2  Avg: 98.4 %  Min: 97 %  Max: 100 %       24 Hour I/O Current Shift I/O  Time Ins Outs 06/04 0701 - 06/05 0700 In: 0  Out: 368 [Urine:300] No intake/output data recorded.    General: NAD Abdomen: obese. Firm fundus at the umbilicus. Perineum: deferred Skin:  Warm and dry.  Respiratory:Normal respiratory effort  Medications Current Facility-Administered Medications  Medication Dose Route Frequency Provider Last Rate Last Dose  . acetaminophen (TYLENOL) tablet 650 mg  650 mg Oral Q4H PRN Reva BoresPratt, Tanya S, MD      . benzocaine-Menthol (DERMOPLAST) 20-0.5 % topical spray 1 application  1 application Topical PRN Reva BoresPratt, Tanya S, MD      . coconut oil  1 application Topical PRN Reva BoresPratt, Tanya S, MD      . witch hazel-glycerin (TUCKS) pad 1 application  1 application Topical PRN Reva BoresPratt, Tanya S, MD       And  . dibucaine (NUPERCAINAL) 1 % rectal ointment 1 application  1 application Rectal PRN Reva BoresPratt, Tanya S, MD      . diphenhydrAMINE (BENADRYL) capsule 25 mg  25 mg Oral Q6H PRN Reva BoresPratt, Tanya S, MD      . ibuprofen (ADVIL) tablet 600 mg  600 mg Oral Q6H  Reva BoresPratt, Tanya S, MD   600 mg at 03/12/19 0744  . lactated ringers infusion   Intravenous Continuous Reva BoresPratt, Tanya S, MD 20 mL/hr at 03/12/19 [id number]    . ondansetron (ZOFRAN) tablet 4 mg  4 mg Oral Q4H PRN Reva BoresPratt, Tanya S, MD       Or  . ondansetron [facility](ZOFRAN) injection 4 mg  4 mg Intravenous Q4H PRN Reva BoresPratt, Tanya S, MD      . oxyCODONE (Oxy IR/ROXICODONE) immediate release tablet 10 mg  10 mg Oral Q4H PRN Reva BoresPratt, Tanya S, MD      . oxyCODONE (Oxy IR/ROXICODONE) immediate release tablet 5 mg  5 mg Oral Q4H PRN Reva BoresPratt, Tanya S, MD      . prenatal multivitamin tablet 1 tablet  1 tablet Oral Q1200 Reva BoresPratt, Tanya S, MD      . Melene Muller[START ON 03/13/2019] senna-docusate (Senokot-S) tablet 2 tablet  2 tablet Oral Q24H Reva BoresPratt, Tanya S, MD      . simethicone [facility](MYLICON) chewable tablet 80 mg  80 mg Oral PRN Reva BoresPratt, Tanya S, MD      . [  START ON 03/13/2019] Tdap (BOOSTRIX) injection 0.5 mL  0.5 mL Intramuscular Once Reva Bores, MD      . zolpidem (AMBIEN) tablet 5 mg  5 mg Oral QHS PRN Reva Bores, MD        Labs:  Recent Labs  Lab 03/11/19 0743 03/11/19 1352 03/11/19 2051  WBC 9.9 11.5* 11.0*  HGB 10.6* 11.0* 10.9*  HCT 33.1* 33.6* 33.2*  PLT 107* 117* 121*   Recent Labs  Lab 03/10/19 1933  NA 137  K 3.8  CL 106  CO2 23  BUN 6  CREATININE 0.49  GLUCOSE 107*  CALCIUM 8.9    Assessment & Plan:  Pt doing well *Postpartum/postop: routine care. Desires BTL. Papers UTD. R/b/a d/w her. Can proceed when OR is ready.  *gHTN: VS normal and stable.  *gestational thrombocytopenia: rpt prior to epidural removal *gdma1: cbg acceptable this morning. 2h GTT pp *Dispo: likely tomorrow  O POS / Rubella Immune / Varicella Unknown/  RPR negative / HIV negative / HepBsAg negative / Tdap UTD: declined/Flu shot: declined/ pap neg 2019 / formula  / Contraception: for btl / Circ: not applicable/ Follow up: [facility]  Cornelia Copa MD Attending Center for [facility])

## 2019-03-12 NOTE — Progress Notes (Signed)
[name] was referred for history of depression/anxiety. * Referral screened out by Clinical Social Worker because none of the following criteria appear to apply: ~ History of anxiety/depression during this pregnancy, or of post-partum depression following prior delivery. ~ Diagnosis of anxiety and/or depression within last 3 years OR * [name]'s symptoms currently being treated with medication and/or therapy. Per [name]'s H&P and further chart review, [name] is on Lexapro has active order for Lexapro.     Please contact the Clinical Social Worker if needs arise, by [name] request, or if [name] scores greater than 9/yes to question 10 on [place] Postpartum Depression Screen.    Christyn Gutkowski S. Marietta Sikkema, MSW, LCSW-A [facility] [place] [phone]   

## 2019-03-13 LAB — GC/CHLAMYDIA PROBE AMP
Chlamydia trachomatis, NAA: NEGATIVE
Neisseria Gonorrhoeae by PCR: NEGATIVE

## 2019-03-13 MED ORDER — IBUPROFEN 600 MG PO TABS: 600.0000 mg | ORAL_TABLET | Freq: Four times a day (QID) | ORAL | 0 refills | Status: DC | PRN

## 2019-03-13 MED ORDER — OXYCODONE HCL 5 MG PO TABS: 5.0000 mg | ORAL_TABLET | ORAL | 0 refills | Status: DC | PRN

## 2019-03-13 NOTE — Discharge Instructions (Signed)
Laparoscopic Tubal Ligation, Care After °Refer to this sheet in the next few weeks. These instructions provide you with information about caring for yourself after your procedure. Your health care provider may also give you more specific instructions. Your treatment has been planned according to current medical practices, but problems sometimes occur. Call your health care provider if you have any problems or questions after your procedure. °What can I expect after the procedure? °After the procedure, it is common to have: °· A sore throat. °· Discomfort in your shoulder. °· Mild discomfort or cramping in your abdomen. °· Gas pains. °· Pain or soreness in the area where the surgical cut (incision) was made. °· A bloated feeling. °· Tiredness. °· Nausea. °· Vomiting. °Follow these instructions at home: °Medicines °· Take over-the-counter and prescription medicines only as told by your health care provider. °· Do not take aspirin because it can cause bleeding. °· Do not drive or operate heavy machinery while taking prescription pain medicine. °Activity °· Rest for the rest of the day. °· Return to your normal activities as told by your health care provider. Ask your health care provider what activities are safe for you. °Incision care ° °  ° °· Follow instructions from your health care provider about how to take care of your incision. Make sure you: °? Wash your hands with soap and water before you change your bandage (dressing). If soap and water are not available, use hand sanitizer. °? Change your dressing as told by your health care provider. °? Leave stitches (sutures) in place. They may need to stay in place for 2 weeks or longer. °· Check your incision area every day for signs of infection. Check for: °? More redness, swelling, or pain. °? More fluid or blood. °? Warmth. °? Pus or a bad smell. °Other Instructions °· Do not take baths, swim, or use a hot tub until your health care provider approves. You may take  showers. °· Keep all follow-up visits as told by your health care provider. This is important. °· Have someone help you with your daily household tasks for the first few days. °Contact a health care provider if: °· You have more redness, swelling, or pain around your incision. °· Your incision feels warm to the touch. °· You have pus or a bad smell coming from your incision. °· The edges of your incision break open after the sutures have been removed. °· Your pain does not improve after 2-3 days. °· You have a rash. °· You repeatedly become dizzy or light-headed. °· Your pain medicine is not helping. °· You are constipated. °Get help right away if: °· You have a fever. °· You faint. °· You have increasing pain in your abdomen. °· You have severe pain in one or both of your shoulders. °· You have fluid or blood coming from your sutures or from your vagina. °· You have shortness of breath or difficulty breathing. °· You have chest pain or leg pain. °· You have ongoing nausea, vomiting, or diarrhea. °This information is not intended to replace advice given to you by your health care provider. Make sure you discuss any questions you have with your health care provider. °Document Released: 04/12/2005 Document Revised: 05/20/2017 Document Reviewed: 09/03/2015 °Elsevier Interactive Patient Education © 2019 [employer] °Vaginal Delivery, Care After °Refer to this sheet in the next few weeks. These instructions provide you with information about caring for yourself after vaginal delivery. Your health care provider may also give   you more specific instructions. Your treatment has been planned according to current medical practices, but problems sometimes occur. Call your health care provider if you have any problems or questions. °What can I expect after the procedure? °After vaginal delivery, it is common to have: °· Some bleeding from your vagina. °· Soreness in your abdomen, your vagina, and the area of skin between your  vaginal opening and your anus (perineum). °· Pelvic cramps. °· Fatigue. °Follow these instructions at home: °Medicines °· Take over-the-counter and prescription medicines only as told by your health care provider. °· If you were prescribed an antibiotic medicine, take it as told by your health care provider. Do not stop taking the antibiotic until it is finished. °Driving ° °· Do not drive or operate heavy machinery while taking prescription pain medicine. °· Do not drive for 24 hours if you received a sedative. °Lifestyle °· Do not drink alcohol. This is especially important if you are breastfeeding or taking medicine to relieve pain. °· Do not use tobacco products, including cigarettes, chewing tobacco, or e-cigarettes. If you need help quitting, ask your health care provider. °Eating and drinking °· Drink at least 8 eight-ounce glasses of water every day unless you are told not to by your health care provider. If you choose to breastfeed your baby, you may need to drink more water than this. °· Eat high-fiber foods every day. These foods may help prevent or relieve constipation. High-fiber foods include: °? Whole grain cereals and breads. °? Brown rice. °? Beans. °? Fresh fruits and vegetables. °Activity °· Return to your normal activities as told by your health care provider. Ask your health care provider what activities are safe for you. °· Rest as much as possible. Try to rest or take a nap when your baby is sleeping. °· Do not lift anything that is heavier than your baby or 10 lb (4.5 kg) until your health care provider says that it is safe. °· Talk with your health care provider about when you can engage in sexual activity. This may depend on your: °? Risk of infection. °? Rate of healing. °? Comfort and desire to engage in sexual activity. °Vaginal Care °· If you have an episiotomy or a vaginal tear, check the area every day for signs of infection. Check for: °? More redness, swelling, or pain. °? More  fluid or blood. °? Warmth. °? Pus or a bad smell. °· Do not use tampons or douches until your health care provider says this is safe. °· Watch for any blood clots that may pass from your vagina. These may look like clumps of dark red, brown, or black discharge. °General instructions °· Keep your perineum clean and dry as told by your health care provider. °· Wear loose, comfortable clothing. °· Wipe from front to back when you use the toilet. °· Ask your health care provider if you can shower or take a bath. If you had an episiotomy or a perineal tear during labor and delivery, your health care provider may tell you not to take baths for a certain length of time. °· Wear a bra that supports your breasts and fits you well. °· If possible, have someone help you with household activities and help care for your baby for at least a few days after you leave the hospital. °· Keep all follow-up visits for you and your baby as told by your health care provider. This is important. °Contact a health care provider if: °· You have: °?   Vaginal discharge that has a bad smell. °? Difficulty urinating. °? Pain when urinating. °? A sudden increase or decrease in the frequency of your bowel movements. °? More redness, swelling, or pain around your episiotomy or vaginal tear. °? More fluid or blood coming from your episiotomy or vaginal tear. °? Pus or a bad smell coming from your episiotomy or vaginal tear. °? A fever. °? A rash. °? Little or no interest in activities you used to enjoy. °? Questions about caring for yourself or your baby. °· Your episiotomy or vaginal tear feels warm to the touch. °· Your episiotomy or vaginal tear is separating or does not appear to be healing. °· Your breasts are painful, hard, or turn red. °· You feel unusually sad or worried. °· You feel nauseous or you vomit. °· You pass large blood clots from your vagina. If you pass a blood clot from your vagina, save it to show to your health care provider. Do  not flush blood clots down the toilet without having your health care provider look at them. °· You urinate more than usual. °· You are dizzy or light-headed. °· You have not breastfed at all and you have not had a menstrual period for 12 weeks after delivery. °· You have stopped breastfeeding and you have not had a menstrual period for 12 weeks after you stopped breastfeeding. °Get help right away if: °· You have: °? Pain that does not go away or does not get better with medicine. °? Chest pain. °? Difficulty breathing. °? Blurred vision or spots in your vision. °? Thoughts about hurting yourself or your baby. °· You develop pain in your abdomen or in one of your legs. °· You develop a severe headache. °· You faint. °· You bleed from your vagina so much that you fill two sanitary pads in one hour. °This information is not intended to replace advice given to you by your health care provider. Make sure you discuss any questions you have with your health care provider. °Document Released: 09/20/2000 Document Revised: 03/06/2016 Document Reviewed: 10/08/2015 °Elsevier Interactive Patient Education © 2019 Else[employer] Inc. ° °

## 2019-03-16 ENCOUNTER — Telehealth: Payer: Self-pay | Admitting: Obstetrics and Gynecology

## 2019-03-16 NOTE — Telephone Encounter (Signed)
Phone call from Kathleen Patrick who is concerned upon noticing some old dark liquid with a bit of odor under the incision dressing at the tubal ligation site.  Dressing removed and the incision is intact. Pt tolerating regular diet, normal bowel function, and soreness around incision site is reducing.  Pt aware that I will be in office in am if she desires f/u. I do not feel there is a surgical complication. Pt will leave dressing off and notify office for fever or drainage or change in status.

## 2019-03-17 ENCOUNTER — Other Ambulatory Visit: Payer: Self-pay | Admitting: Obstetrics and Gynecology

## 2019-03-17 ENCOUNTER — Ambulatory Visit (INDEPENDENT_AMBULATORY_CARE_PROVIDER_SITE_OTHER): Payer: Medicaid Other | Admitting: Obstetrics and Gynecology

## 2019-03-17 ENCOUNTER — Inpatient Hospital Stay (HOSPITAL_COMMUNITY): Payer: Medicaid Other

## 2019-03-17 ENCOUNTER — Other Ambulatory Visit: Payer: Self-pay

## 2019-03-17 ENCOUNTER — Inpatient Hospital Stay (HOSPITAL_COMMUNITY)
Admission: AD | Admit: 2019-03-17 | Discharge: 2019-03-20 | DRG: 856 | Disposition: A | Discharge status: Home Health | Payer: Medicaid Other | Source: Ambulatory Visit | Attending: Obstetrics and Gynecology | Admitting: Obstetrics and Gynecology

## 2019-03-17 ENCOUNTER — Telehealth: Payer: Self-pay | Admitting: Obstetrics and Gynecology

## 2019-03-17 ENCOUNTER — Encounter: Payer: Self-pay | Admitting: Obstetrics and Gynecology

## 2019-03-17 VITALS — BP 133/94 | HR 104 | Temp 98.4°F | Ht 64.0 in | Wt 236.6 lb

## 2019-03-17 DIAGNOSIS — Z87891 Personal history of nicotine dependence: Secondary | ICD-10-CM | POA: Diagnosis not present

## 2019-03-17 DIAGNOSIS — Z791 Long term (current) use of non-steroidal anti-inflammatories (NSAID): Secondary | ICD-10-CM | POA: Diagnosis not present

## 2019-03-17 DIAGNOSIS — L0390 Cellulitis, unspecified: Secondary | ICD-10-CM

## 2019-03-17 DIAGNOSIS — I10 Essential (primary) hypertension: Secondary | ICD-10-CM | POA: Diagnosis not present

## 2019-03-17 DIAGNOSIS — L24A9 Irritant contact dermatitis due friction or contact with other specified body fluids: Secondary | ICD-10-CM

## 2019-03-17 DIAGNOSIS — T8149XA Infection following a procedure, other surgical site, initial encounter: Secondary | ICD-10-CM | POA: Diagnosis not present

## 2019-03-17 DIAGNOSIS — M726 Necrotizing fasciitis: Secondary | ICD-10-CM | POA: Diagnosis not present

## 2019-03-17 DIAGNOSIS — Z79899 Other long term (current) drug therapy: Secondary | ICD-10-CM | POA: Diagnosis not present

## 2019-03-17 DIAGNOSIS — M793 Panniculitis, unspecified: Secondary | ICD-10-CM | POA: Diagnosis present

## 2019-03-17 DIAGNOSIS — T148XXA Other injury of unspecified body region, initial encounter: Secondary | ICD-10-CM

## 2019-03-17 DIAGNOSIS — F988 Other specified behavioral and emotional disorders with onset usually occurring in childhood and adolescence: Secondary | ICD-10-CM | POA: Diagnosis present

## 2019-03-17 DIAGNOSIS — L7622 Postprocedural hemorrhage and hematoma of skin and subcutaneous tissue following other procedure: Secondary | ICD-10-CM | POA: Diagnosis present

## 2019-03-17 DIAGNOSIS — Z1159 Encounter for screening for other viral diseases: Secondary | ICD-10-CM | POA: Diagnosis not present

## 2019-03-17 LAB — COMPREHENSIVE METABOLIC PANEL WITH GFR
ALT: 12 U/L (ref 0–44)
AST: 10 U/L — ABNORMAL LOW (ref 15–41)
Albumin: 2.7 g/dL — ABNORMAL LOW (ref 3.5–5.0)
Alkaline Phosphatase: 92 U/L (ref 38–126)
Anion gap: 12 (ref 5–15)
BUN: 12 mg/dL (ref 6–20)
CO2: 21 mmol/L — ABNORMAL LOW (ref 22–32)
Calcium: 8.3 mg/dL — ABNORMAL LOW (ref 8.9–10.3)
Chloride: 108 mmol/L (ref 98–111)
Creatinine, Ser: 0.5 mg/dL (ref 0.44–1.00)
GFR calc Af Amer: >60 mL/min
GFR calc non Af Amer: >60 mL/min
Glucose, Bld: 87 mg/dL (ref 70–99)
Potassium: 3.3 mmol/L — ABNORMAL LOW (ref 3.5–5.1)
Sodium: 141 mmol/L (ref 135–145)
Total Bilirubin: 0.5 mg/dL (ref 0.3–1.2)
Total Protein: 6.7 g/dL (ref 6.5–8.1)

## 2019-03-17 LAB — CBC WITH DIFFERENTIAL/PLATELET
Abs Immature Granulocytes: 0.09 10*3/uL — ABNORMAL HIGH (ref 0.00–0.07)
Basophils Absolute: 0 10*3/uL (ref 0.0–0.1)
Basophils Relative: 0 %
Eosinophils Absolute: 0 10*3/uL (ref 0.0–0.5)
Eosinophils Relative: 0 %
HCT: 30.6 % — ABNORMAL LOW (ref 36.0–46.0)
Hemoglobin: 9.5 g/dL — ABNORMAL LOW (ref 12.0–15.0)
Immature Granulocytes: 1 %
Lymphocytes Relative: 12 %
Lymphs Abs: 1.5 10*3/uL (ref 0.7–4.0)
MCH: 27.6 pg (ref 26.0–34.0)
MCHC: 31 g/dL (ref 30.0–36.0)
MCV: 89 fL (ref 80.0–100.0)
Monocytes Absolute: 0.5 10*3/uL (ref 0.1–1.0)
Monocytes Relative: 5 %
Neutro Abs: 9.7 10*3/uL — ABNORMAL HIGH (ref 1.7–7.7)
Neutrophils Relative %: 82 %
Platelets: 148 10*3/uL — ABNORMAL LOW (ref 150–400)
RBC: 3.44 MIL/uL — ABNORMAL LOW (ref 3.87–5.11)
RDW: 13 % (ref 11.5–15.5)
WBC: 11.9 10*3/uL — ABNORMAL HIGH (ref 4.0–10.5)
nRBC: 0 % (ref 0.0–0.2)

## 2019-03-17 LAB — SARS CORONAVIRUS 2 BY RT PCR (HOSPITAL ORDER, PERFORMED IN ~~LOC~~ HOSPITAL LAB): SARS Coronavirus 2: NEGATIVE

## 2019-03-17 LAB — SEDIMENTATION RATE: Sed Rate: 75 mm/h — ABNORMAL HIGH (ref 0–22)

## 2019-03-17 MED ORDER — HYDROMORPHONE HCL 2 MG/ML IJ SOLN: 1.0000 mg | INTRAMUSCULAR | Status: DC | PRN

## 2019-03-17 MED ORDER — TRAMADOL HCL 50 MG PO TABS: 50.0000 mg | ORAL_TABLET | Freq: Four times a day (QID) | ORAL | Status: DC | PRN

## 2019-03-17 MED ORDER — TRAMADOL HCL 50 MG PO TABS
50.0000 mg | ORAL_TABLET | Freq: Four times a day (QID) | ORAL | Status: DC | PRN
  Filled 2019-03-17: qty 1

## 2019-03-17 MED ORDER — IOHEXOL 300 MG/ML  SOLN
30.0000 mL | Freq: Once | INTRAMUSCULAR | Status: AC | PRN
  Administered 2019-03-17: 30 mL via ORAL

## 2019-03-17 MED ORDER — ENOXAPARIN SODIUM 150 MG/ML ~~LOC~~ SOLN: 40.0000 mg | SUBCUTANEOUS | Status: DC

## 2019-03-17 MED ORDER — SODIUM CHLORIDE 0.9 % IV SOLN
INTRAVENOUS | Status: DC
  Administered 2019-03-17 – 2019-03-19 (×4): via INTRAVENOUS

## 2019-03-17 MED ORDER — HYDROMORPHONE HCL 1 MG/ML IJ SOLN
1.0000 mg | INTRAMUSCULAR | Status: DC | PRN
  Administered 2019-03-17 – 2019-03-19 (×10): 1 mg via INTRAVENOUS
  Administered 2019-03-19: 0.5 mg via INTRAVENOUS
  Administered 2019-03-20 (×2): 1 mg via INTRAVENOUS
  Filled 2019-03-17 (×11): qty 1

## 2019-03-17 MED ORDER — IOHEXOL 300 MG/ML  SOLN
100.0000 mL | Freq: Once | INTRAMUSCULAR | Status: AC | PRN
  Administered 2019-03-17: 100 mL via INTRAVENOUS

## 2019-03-17 MED ORDER — ENOXAPARIN SODIUM 40 MG/0.4ML ~~LOC~~ SOLN
40.0000 mg | SUBCUTANEOUS | Status: DC
  Administered 2019-03-17 – 2019-03-18 (×2): 40 mg via SUBCUTANEOUS
  Filled 2019-03-17 (×2): qty 0.4

## 2019-03-17 MED ORDER — SODIUM CHLORIDE 0.9 % IV SOLN: INTRAVENOUS | Status: DC

## 2019-03-17 MED ORDER — IBUPROFEN 600 MG PO TABS: 600.0000 mg | ORAL_TABLET | Freq: Four times a day (QID) | ORAL | Status: DC | PRN

## 2019-03-17 MED ORDER — IBUPROFEN 200 MG PO TABS: 600.0000 mg | ORAL_TABLET | Freq: Four times a day (QID) | ORAL | Status: DC | PRN

## 2019-03-17 MED ORDER — DEXTROSE 5 % IV SOLN: 900.0000 mg | Freq: Three times a day (TID) | INTRAVENOUS | Status: DC

## 2019-03-17 MED ORDER — METOCLOPRAMIDE HCL 5 MG/ML IJ SOLN: 10.0000 mg | Freq: Four times a day (QID) | INTRAMUSCULAR | Status: DC | PRN

## 2019-03-17 MED ORDER — DOCUSATE SODIUM 50 MG PO CAPS: 100.0000 mg | ORAL_CAPSULE | Freq: Two times a day (BID) | ORAL | Status: AC

## 2019-03-17 MED ORDER — PRENATAL MULTIVITAMIN CH
1.0000 | ORAL_TABLET | Freq: Every day | ORAL | Status: DC
  Administered 2019-03-18: 1 via ORAL
  Filled 2019-03-17 (×5): qty 1

## 2019-03-17 MED ORDER — DOCUSATE SODIUM 100 MG PO CAPS
100.0000 mg | ORAL_CAPSULE | Freq: Two times a day (BID) | ORAL | Status: DC
  Administered 2019-03-18 – 2019-03-20 (×3): 100 mg via ORAL
  Filled 2019-03-17 (×4): qty 1

## 2019-03-17 MED ORDER — PRENATAL MULTIVITAMIN CH: 1.0000 | ORAL_TABLET | Freq: Every day | ORAL | Status: DC

## 2019-03-17 MED ORDER — VANCOMYCIN HCL 10 G IV SOLR: 1500.0000 mg | Freq: Once | INTRAVENOUS | Status: DC

## 2019-03-17 NOTE — Telephone Encounter (Signed)
Pt has continued drainage from incision, and has made an appt for 10:15 this morning Bottle feeding.  No fever.  Plan:  Obtain culture, Rx Septra or Doxy. Photo document.

## 2019-03-17 NOTE — H&P (Signed)
[facility] Visit  @DATE @            Patient name: Kathleen Patrick          MRN [id number]  Date of birth: 1991/05/25  CC & HPI:  Kathleen GemmaCatherine Badger is a 28 y.o. female presenting today for drainage from laparoscopic site from tubal since yesterday. Denies vomiting and fever. Daughter is carrier of  MRSA, was infected by step-sister.No recent known infections.  ROS:  ROS +cellulitis post-op wound , malodorous discharge -emesis -fever  Pertinent History Reviewed:   Reviewed: Medical             Past Medical History:  Diagnosis Date  . ADD (attention deficit disorder)   . Allergy   . Anemia   . Depression   . Dizzy 06/08/2014  . Gestational diabetes   . Irregular bleeding 12/01/2013  . Pregnancy induced hypertension   . RLQ abdominal pain 12/01/2013                              Surgical Hx:         Past Surgical History:  Procedure Laterality Date  . SUPERFICIAL LYMPH NODE BIOPSY / EXCISION    . TUBAL LIGATION N/A 03/12/2019   Procedure: POST PARTUM TUBAL LIGATION;  Surgeon: Seymour BingPickens, Charlie, MD;  Location: [facility] LD ORS;  Service: Gynecology;  Laterality: N/A;   Medications: Reviewed & Updated - see associated section                       Current Outpatient Medications:  .  escitalopram (LEXAPRO) 10 MG tablet, Take 1 tablet (10 mg total) by mouth daily., Disp: 30 tablet, Rfl: 6 .  ibuprofen (ADVIL) 600 MG tablet, Take 1 tablet (600 mg total) by mouth every 6 (six) hours as needed., Disp: 30 tablet, Rfl: 0 .  prenatal vitamin w/FE, FA (PRENATAL 1 + 1) 27-1 MG TABS tablet, Take 1 tablet by mouth daily at 12 noon., Disp: 30 each, Rfl: 12 .  aspirin EC 81 MG tablet, Take 2 tablets (162 mg total) by mouth daily. (Patient not taking: Reported on 03/17/2019), Disp: 60 tablet, Rfl: 6 .  oxyCODONE (OXY IR/ROXICODONE) 5 MG immediate release tablet, Take 1 tablet (5 mg total) by mouth every 4 (four) hours as needed (pain scale 4-7). (Patient not taking: Reported on  03/17/2019), Disp: 15 tablet, Rfl: 0   Social History: Reviewed -  reports that she quit smoking about 6 years ago. Her smoking use included cigarettes. She smoked 1.00 pack per day. She has never used smokeless tobacco.  Objective Findings:  Vitals: Blood pressure (!) 133/94, pulse (!) 104, temperature 98.4 F (36.9 C), height 5\' 4"  (1.626 m), weight 236 lb 9.6 oz (107.3 kg), last menstrual period 06/22/2018, not currently breastfeeding.  PHYSICAL EXAMINATION General appearance - in mild to moderate distress Mental status - alert, oriented to person, place, and time, normal mood, behavior, speech, dress, motor activity, and thought processes, anxious due to incision and drainage of infected area  Abdomen - post-op wound cellulitis    PELVIC Not examined   Incision & Drainage:  INCISION AND DRAINAGE PROCEDURE NOTE: Patient identification was confirmed and verbal consent was obtained. opened to depth of 2.5 cm for post-op wound cellulitis on abdomen.   Site anesthetized with 20 cc lidocaine without epinephrine, fan technique, incision opened,  Over surgical site, cultured, debrided along umbilical stalk where  necrotic fascia noted, until uncomfortable to pt, 4x4 wick dressing used. EBL minimal Then the area was covered with dry, sterile dressing.    Pt tolerated procedure well without complications. Instructions for care discussed verbally with patient and additional instructions for homecare and f/u.   Assessment & Plan:   A:  1.  Post-op wound cellulitis, MRSA vs Necrotizing Fasciitis. Will admit for IV antibiotics, dressing changes, CT abdomen on admission.  P:  1. Admit to [facility] is full at present) for IV antibiotics , will cover Vancomycin, plus Clindamycin.    By signing my name below, I, Samul Dada, attest that this documentation has been prepared under the direction and in the presence of Jonnie Kind, MD. Electronically Signed: [place]. 03/17/19. 11:05 AM.  I personally performed the services described in this documentation, which was SCRIBED in my presence. The recorded information has been reviewed and considered accurate. It has been edited as necessary during review. Jonnie Kind, MD

## 2019-03-17 NOTE — Progress Notes (Signed)
Patient ID: Kathleen GemmaCatherine Moring, female   DOB: 13-Mar-1991, 28 y.o.   MRN: [id number]    [facility]  @DATE @            Patient name: Kathleen Patrick MRN [id number]  Date of birth: 13-Mar-1991  CC & HPI:  Kathleen Patrick is a 28 y.o. female presenting today for drainage from laparoscopic site from tubal since yesterday. Denies vomiting and fever. Daughter is carrier of  MRSA, was infected by step-sister.No recent known infections.  ROS:  ROS +cellulitis post-op wound , malodorous discharge -emesis -fever  Pertinent History Reviewed:   Reviewed: Medical         Past Medical History:  Diagnosis Date  . ADD (attention deficit disorder)   . Allergy   . Anemia   . Depression   . Dizzy 06/08/2014  . Gestational diabetes   . Irregular bleeding 12/01/2013  . Pregnancy induced hypertension   . RLQ abdominal pain 12/01/2013                              Surgical Hx:    Past Surgical History:  Procedure Laterality Date  . SUPERFICIAL LYMPH NODE BIOPSY / EXCISION    . TUBAL LIGATION N/A 03/12/2019   Procedure: POST PARTUM TUBAL LIGATION;  Surgeon: [place] BingPickens, Charlie, MD;  Location: [facility] LD ORS;  Service: Gynecology;  Laterality: N/A;   Medications: Reviewed & Updated - see associated section                       Current Outpatient Medications:  .  escitalopram (LEXAPRO) 10 MG tablet, Take 1 tablet (10 mg total) by mouth daily., Disp: 30 tablet, Rfl: 6 .  ibuprofen (ADVIL) 600 MG tablet, Take 1 tablet (600 mg total) by mouth every 6 (six) hours as needed., Disp: 30 tablet, Rfl: 0 .  prenatal vitamin w/FE, FA (PRENATAL 1 + 1) 27-1 MG TABS tablet, Take 1 tablet by mouth daily at 12 noon., Disp: 30 each, Rfl: 12 .  aspirin EC 81 MG tablet, Take 2 tablets (162 mg total) by mouth daily. (Patient not taking: Reported on 03/17/2019), Disp: 60 tablet, Rfl: 6 .  oxyCODONE (OXY IR/ROXICODONE) 5 MG immediate release tablet, Take 1 tablet (5 mg total) by mouth every 4 (four) hours as needed  (pain scale 4-7). (Patient not taking: Reported on 03/17/2019), Disp: 15 tablet, Rfl: 0   Social History: Reviewed -  reports that she quit smoking about 6 years ago. Her smoking use included cigarettes. She smoked 1.00 pack per day. She has never used smokeless tobacco.  Objective Findings:  Vitals: Blood pressure (!) 133/94, pulse (!) 104, temperature 98.4 F (36.9 C), height 5\' 4"  (1.626 m), weight 236 lb 9.6 oz (107.3 kg), last menstrual period 06/22/2018, not currently breastfeeding.  PHYSICAL EXAMINATION General appearance - in mild to moderate distress Mental status - alert, oriented to person, place, and time, normal mood, behavior, speech, dress, motor activity, and thought processes, anxious due to incision and drainage of infected area  Abdomen - post-op wound cellulitis    PELVIC Not examined   Incision & Drainage:  INCISION AND DRAINAGE PROCEDURE NOTE: Patient identification was confirmed and verbal consent was obtained. opened to depth of 2.5 cm for post-op wound cellulitis on abdomen.   Site anesthetized with 20 cc lidocaine without epinephrine, fan technique, incision opened,  Over surgical site, cultured, debrided along umbilical stalk where  necrotic fascia noted, until uncomfortable to pt, 4x4 wick dressing used. EBL minimal Then the area was covered with dry, sterile dressing.     Pt tolerated procedure well without complications.  Instructions for care discussed verbally with patient and additional instructions for homecare and f/u.   Assessment & Plan:   A:  1.  Post-op wound cellulitis, MRSA vs Necrotizing Fasciitis. Will admit for IV antibiotics, dressing changes, CT abdomen on admission.  P:  1. Admit to [facility] is full at present) for IV antibiotics , will cover Vancomycin, plus Clindamycin.    By signing my name below, I, Samul Dada, attest that this documentation has been prepared under the direction and in the presence of Jonnie Kind, MD.  Electronically Signed: [place]. 03/17/19. 11:05 AM.  I personally performed the services described in this documentation, which was SCRIBED in my presence. The recorded information has been reviewed and considered accurate. It has been edited as necessary during review. Jonnie Kind, MD

## 2019-03-18 ENCOUNTER — Encounter (HOSPITAL_COMMUNITY): Payer: Self-pay

## 2019-03-18 LAB — MRSA PCR SCREENING: MRSA by PCR: NEGATIVE

## 2019-03-18 MED ORDER — VANCOMYCIN HCL 1.25 G IV SOLR
1250.0000 mg | Freq: Three times a day (TID) | INTRAVENOUS | Status: DC
  Administered 2019-03-18 – 2019-03-19 (×2): 1250 mg via INTRAVENOUS
  Filled 2019-03-18 (×12): qty 1250

## 2019-03-18 MED ORDER — VANCOMYCIN HCL IN DEXTROSE 1-5 GM/200ML-% IV SOLN
1000.0000 mg | INTRAVENOUS | Status: AC
  Administered 2019-03-18 (×2): 1000 mg via INTRAVENOUS
  Filled 2019-03-18: qty 200

## 2019-03-18 MED ORDER — CLINDAMYCIN PHOSPHATE 900 MG/50ML IV SOLN
900.0000 mg | Freq: Three times a day (TID) | INTRAVENOUS | Status: DC
  Administered 2019-03-18 – 2019-03-20 (×7): 900 mg via INTRAVENOUS
  Filled 2019-03-18 (×8): qty 50

## 2019-03-18 MED ORDER — FENTANYL CITRATE (PF) 100 MCG/2ML IJ SOLN
100.0000 ug | Freq: Once | INTRAMUSCULAR | Status: AC
  Administered 2019-03-18: 100 ug via INTRAVENOUS
  Filled 2019-03-18: qty 2

## 2019-03-18 NOTE — Progress Notes (Signed)
Pharmacy Antibiotic Note  Kathleen Patrick is a 28 y.o. female admitted on 03/17/2019 with cellulitis.  Pharmacy has been consulted for Vancomycin dosing.  Plan: Vancomycin 2000mg  loading dose, then 1250mg  IV every 8 hours.  Goal trough 10-15 mcg/mL.  Also on clindamycin 900mg  IV q8h F/U cxs and clinical progress Monitor V/S, labs and levels as indicated  Height: 5\' 4"  (162.6 cm) Weight: 237 lb (107.5 kg) IBW/kg (Calculated) : 54.7  Temp (24hrs), Avg:98.5 F (36.9 C), Min:98.4 F (36.9 C), Max:98.7 F (37.1 C)  Recent Labs  Lab 03/11/19 1352 03/11/19 2051 03/12/19 1137 03/17/19 1741  WBC 11.5* 11.0* 8.3 11.9*  CREATININE  --   --   --  0.50    Estimated Creatinine Clearance: 126.4 mL/min (by C-G formula based on SCr of 0.5 mg/dL).    Allergies  Allergen Reactions  . Cefzil [Cefprozil] Hives    Antimicrobials this admission: Vancomycin 6/11 >> Clindamycin 6/11 >>   Dose adjustments this admission: N/A  Microbiology results:   MRSA PCR:   Thank you for allowing pharmacy to be a part of this patient's care.  Isac Sarna, BS Pharm D, [place] Clinical Pharmacist Pager [phone] 03/18/2019 9:02 AM

## 2019-03-18 NOTE — Progress Notes (Signed)
Old dressing removed, wound cleaned with NSS. Packed with saline-dampened gauze, covered with 4x4s and hyperfix tape. Tolerated with moderate discomfort. Premedicated with PRN Dilaudid. Periwound red, yellow, wet granulation tissue. To be NPO after midnight for debridement by Dr. Glo Herring in AM. Will conitnue to monitor.

## 2019-03-18 NOTE — Progress Notes (Signed)
Subjective:  S/P admission for cellulitis/fasciitis from postpartum tubal. Patient reports continued malodor from incision. Dressing has become rather saturated.   Pt received notice this am that her job was phased out at employers. Pt handling stresses with appropriate stress. No SI, HI. Objective: I have reviewed patient's vital signs, intake and output, medications and labs. The patient did NOT receive the clindamycin and Vancomycin overnight as expected due to ordering error. Pt orders have been re-reviewed and confirmed with Pharmacy.  General: alert, cooperative and no distress Resp: clear to auscultation bilaterally GI: soft, non-tender; bowel sounds normal; no masses,  no organomegaly and incision: purulent and malodorous drainage present and erythema is slightly improved. the skin edge has a small area of necrosis at the incision edge at 6  oclock position. wound irrigated and wet-dry dressing replaced. will have dressing changed in 12 hours. CT-scan of the abdomen IMPRESSION: 1. Periumbilical incision site with no subcutaneous fluid collection. Heterogeneous material anteriorly is probably bandaging. 2. Multiple hypoattenuating lesions within the liver that resolve on the delayed phase images. These are favored to be hemangiomas, but are strictly indeterminate. In the context of small amount of perihepatic ascites, hepatic ultrasound is recommended for further evaluation.   Electronically Signed   By: Ulyses Jarred M.D. CBC    Component Value Date/Time   WBC 11.9 (H) 03/17/2019 1741   RBC 3.44 (L) 03/17/2019 1741   HGB 9.5 (L) 03/17/2019 1741   HGB 11.7 02/23/2019 1116   HCT 30.6 (L) 03/17/2019 1741   HCT 34.7 02/23/2019 1116   PLT 148 (L) 03/17/2019 1741   PLT 118 (L) 02/23/2019 1116   MCV 89.0 03/17/2019 1741   MCV 86 02/23/2019 1116   MCH 27.6 03/17/2019 1741   MCHC 31.0 03/17/2019 1741   RDW 13.0 03/17/2019 1741   RDW 11.9 02/23/2019 1116   LYMPHSABS 1.5  03/17/2019 1741   LYMPHSABS 1.7 09/23/2018 0949   MONOABS 0.5 03/17/2019 1741   EOSABS 0.0 03/17/2019 1741   EOSABS 0.1 09/23/2018 0949   BASOSABS 0.0 03/17/2019 1741   BASOSABS 0.0 09/23/2018 0949   CMP Latest Ref Rng & Units 03/17/2019 03/10/2019 02/23/2019  Glucose 70 - 99 mg/dL 87 107(H) 79  BUN 6 - 20 mg/dL 12 6 5(L)  Creatinine 0.44 - 1.00 mg/dL 0.50 0.49 0.51(L)  Sodium 135 - 145 mmol/L 141 137 139  Potassium 3.5 - 5.1 mmol/L 3.3(L) 3.8 3.8  Chloride 98 - 111 mmol/L 108 106 104  CO2 22 - 32 mmol/L 21(L) 23 21  Calcium 8.9 - 10.3 mg/dL 8.3(L) 8.9 8.8  Total Protein 6.5 - 8.1 g/dL 6.7 6.5 6.3  Total Bilirubin 0.3 - 1.2 mg/dL 0.5 0.5 0.3  Alkaline Phos 38 - 126 U/L 92 107 109  AST 15 - 41 U/L 10(L) 17 10  ALT 0 - 44 U/L 12 17 9     Assessment/Plan: Cellulitis / Fasciitis of umbilicus will require antibiotics and further debridement.  Will schedule for Friday morning at 9 am.   LOS: 1 day    Jonnie Kind 03/18/2019, 10:52 AM

## 2019-03-19 ENCOUNTER — Encounter (HOSPITAL_COMMUNITY)
Admission: AD | Disposition: A | Discharge status: Home Health | Payer: Self-pay | Source: Ambulatory Visit | Attending: Obstetrics and Gynecology

## 2019-03-19 ENCOUNTER — Inpatient Hospital Stay (HOSPITAL_COMMUNITY): Payer: Medicaid Other | Admitting: Anesthesiology

## 2019-03-19 ENCOUNTER — Encounter (HOSPITAL_COMMUNITY): Payer: Self-pay | Admitting: Anesthesiology

## 2019-03-19 ENCOUNTER — Telehealth: Payer: Self-pay | Admitting: Obstetrics and Gynecology

## 2019-03-19 DIAGNOSIS — M726 Necrotizing fasciitis: Secondary | ICD-10-CM

## 2019-03-19 DIAGNOSIS — L0390 Cellulitis, unspecified: Secondary | ICD-10-CM

## 2019-03-19 HISTORY — PX: WOUND EXPLORATION: SHX6188

## 2019-03-19 HISTORY — PX: WOUND DEBRIDEMENT: SHX247

## 2019-03-19 LAB — SURGICAL PCR SCREEN
MRSA, PCR: NEGATIVE
Staphylococcus aureus: POSITIVE — AB

## 2019-03-19 SURGERY — DEBRIDEMENT, WOUND, ABDOMEN
Anesthesia: General | Site: Abdomen

## 2019-03-19 SURGERY — WOUND EXPLORATION
Anesthesia: General | Site: Abdomen

## 2019-03-19 MED ORDER — METOCLOPRAMIDE HCL 5 MG/ML IJ SOLN: 10.0000 mg | Freq: Four times a day (QID) | INTRAMUSCULAR | Status: DC | PRN

## 2019-03-19 MED ORDER — DOXYCYCLINE HYCLATE 100 MG PO TABS
100.0000 mg | ORAL_TABLET | Freq: Two times a day (BID) | ORAL | Status: DC
  Administered 2019-03-19 – 2019-03-20 (×3): 100 mg via ORAL
  Filled 2019-03-19 (×3): qty 1

## 2019-03-19 MED ORDER — SUGAMMADEX SODIUM 200 MG/2ML IV SOLN
INTRAVENOUS | Status: DC | PRN
  Administered 2019-03-19: 200 mg via INTRAVENOUS

## 2019-03-19 MED ORDER — FENTANYL CITRATE (PF) 250 MCG/5ML IJ SOLN
INTRAMUSCULAR | Status: AC
  Filled 2019-03-19: qty 5

## 2019-03-19 MED ORDER — 0.9 % SODIUM CHLORIDE (POUR BTL) OPTIME
TOPICAL | Status: DC | PRN
  Administered 2019-03-19: 1000 mL

## 2019-03-19 MED ORDER — HYDROCODONE-ACETAMINOPHEN 7.5-325 MG PO TABS: 1.0000 | ORAL_TABLET | Freq: Once | ORAL | Status: DC | PRN

## 2019-03-19 MED ORDER — FENTANYL CITRATE (PF) 100 MCG/2ML IJ SOLN
INTRAMUSCULAR | Status: DC | PRN
  Administered 2019-03-19 (×3): 25 ug via INTRAVENOUS

## 2019-03-19 MED ORDER — KETOROLAC TROMETHAMINE 30 MG/ML IJ SOLN
INTRAMUSCULAR | Status: AC
  Filled 2019-03-19: qty 1

## 2019-03-19 MED ORDER — ONDANSETRON HCL 4 MG/2ML IJ SOLN
INTRAMUSCULAR | Status: AC
  Filled 2019-03-19: qty 2

## 2019-03-19 MED ORDER — PROPOFOL 10 MG/ML IV BOLUS
INTRAVENOUS | Status: DC | PRN
  Administered 2019-03-19: 160 mg via INTRAVENOUS

## 2019-03-19 MED ORDER — TRAMADOL HCL 50 MG PO TABS
50.0000 mg | ORAL_TABLET | Freq: Four times a day (QID) | ORAL | Status: DC | PRN
  Administered 2019-03-19: 50 mg via ORAL

## 2019-03-19 MED ORDER — TRANEXAMIC ACID-NACL 1000-0.7 MG/100ML-% IV SOLN
1000.0000 mg | INTRAVENOUS | Status: AC
  Administered 2019-03-19: 12:00:00 1000 mg via INTRAVENOUS
  Filled 2019-03-19: qty 100

## 2019-03-19 MED ORDER — CLINDAMYCIN PHOSPHATE 900 MG/50ML IV SOLN: 900.0000 mg | Freq: Three times a day (TID) | INTRAVENOUS | Status: DC

## 2019-03-19 MED ORDER — HYDROMORPHONE HCL 1 MG/ML IJ SOLN
INTRAMUSCULAR | Status: AC
  Filled 2019-03-19: qty 0.5

## 2019-03-19 MED ORDER — ROCURONIUM BROMIDE 100 MG/10ML IV SOLN
INTRAVENOUS | Status: DC | PRN
  Administered 2019-03-19: 5 mg via INTRAVENOUS
  Administered 2019-03-19: 20 mg via INTRAVENOUS

## 2019-03-19 MED ORDER — SUCCINYLCHOLINE CHLORIDE 200 MG/10ML IV SOSY
PREFILLED_SYRINGE | INTRAVENOUS | Status: DC | PRN
  Administered 2019-03-19: 140 mg via INTRAVENOUS

## 2019-03-19 MED ORDER — PROMETHAZINE HCL 25 MG/ML IJ SOLN: 6.2500 mg | INTRAMUSCULAR | Status: DC | PRN

## 2019-03-19 MED ORDER — FENTANYL CITRATE (PF) 100 MCG/2ML IJ SOLN
INTRAMUSCULAR | Status: AC
  Filled 2019-03-19: qty 2

## 2019-03-19 MED ORDER — FENTANYL CITRATE (PF) 100 MCG/2ML IJ SOLN
INTRAMUSCULAR | Status: DC | PRN
  Administered 2019-03-19: 50 ug via INTRAVENOUS
  Administered 2019-03-19 (×2): 25 ug via INTRAVENOUS
  Administered 2019-03-19: 50 ug via INTRAVENOUS
  Administered 2019-03-19 (×2): 25 ug via INTRAVENOUS

## 2019-03-19 MED ORDER — SUCCINYLCHOLINE CHLORIDE 20 MG/ML IJ SOLN
INTRAMUSCULAR | Status: DC | PRN
  Administered 2019-03-19: 130 mg via INTRAVENOUS

## 2019-03-19 MED ORDER — MIDAZOLAM HCL 2 MG/2ML IJ SOLN
INTRAMUSCULAR | Status: AC
  Filled 2019-03-19: qty 2

## 2019-03-19 MED ORDER — SODIUM CHLORIDE 0.9 % IV SOLN
INTRAVENOUS | Status: DC
  Administered 2019-03-19: 20:00:00 via INTRAVENOUS

## 2019-03-19 MED ORDER — PROPOFOL 10 MG/ML IV BOLUS
INTRAVENOUS | Status: AC
  Filled 2019-03-19: qty 40

## 2019-03-19 MED ORDER — MIDAZOLAM HCL 2 MG/2ML IJ SOLN
INTRAMUSCULAR | Status: DC | PRN
  Administered 2019-03-19: 1 mg via INTRAVENOUS

## 2019-03-19 MED ORDER — PRENATAL PLUS 27-1 MG PO TABS: 1.0000 | ORAL_TABLET | Freq: Every day | ORAL | Status: DC

## 2019-03-19 MED ORDER — LIDOCAINE 2% (20 MG/ML) 5 ML SYRINGE
INTRAMUSCULAR | Status: AC
  Filled 2019-03-19: qty 5

## 2019-03-19 MED ORDER — PRENATAL MULTIVITAMIN CH: 1.0000 | ORAL_TABLET | Freq: Every day | ORAL | Status: DC

## 2019-03-19 MED ORDER — HYDROMORPHONE HCL 1 MG/ML IJ SOLN
0.2500 mg | INTRAMUSCULAR | Status: DC | PRN
  Administered 2019-03-19 (×2): 0.5 mg via INTRAVENOUS

## 2019-03-19 MED ORDER — ONDANSETRON HCL 4 MG/2ML IJ SOLN
INTRAMUSCULAR | Status: DC | PRN
  Administered 2019-03-19: 4 mg via INTRAVENOUS

## 2019-03-19 MED ORDER — BACITRACIN ZINC 500 UNIT/GM EX OINT
TOPICAL_OINTMENT | CUTANEOUS | Status: AC
  Filled 2019-03-19: qty 0.9

## 2019-03-19 MED ORDER — HYDROMORPHONE HCL 1 MG/ML IJ SOLN
1.0000 mg | INTRAMUSCULAR | Status: DC | PRN
  Filled 2019-03-19 (×4): qty 1

## 2019-03-19 MED ORDER — KETOROLAC TROMETHAMINE 30 MG/ML IJ SOLN
INTRAMUSCULAR | Status: DC | PRN
  Administered 2019-03-19: 30 mg via INTRAVENOUS

## 2019-03-19 MED ORDER — ROCURONIUM BROMIDE 100 MG/10ML IV SOLN
INTRAVENOUS | Status: DC | PRN
  Administered 2019-03-19: 8 mg via INTRAVENOUS

## 2019-03-19 MED ORDER — ROCURONIUM BROMIDE 10 MG/ML (PF) SYRINGE
PREFILLED_SYRINGE | INTRAVENOUS | Status: AC
  Filled 2019-03-19: qty 10

## 2019-03-19 MED ORDER — PROPOFOL 10 MG/ML IV BOLUS
INTRAVENOUS | Status: DC | PRN
  Administered 2019-03-19: 20 mg via INTRAVENOUS
  Administered 2019-03-19: 200 mg via INTRAVENOUS

## 2019-03-19 MED ORDER — SUCCINYLCHOLINE CHLORIDE 200 MG/10ML IV SOSY
PREFILLED_SYRINGE | INTRAVENOUS | Status: AC
  Filled 2019-03-19: qty 10

## 2019-03-19 MED ORDER — ESCITALOPRAM OXALATE 10 MG PO TABS
10.0000 mg | ORAL_TABLET | Freq: Every day | ORAL | Status: DC
  Administered 2019-03-19 – 2019-03-20 (×2): 10 mg via ORAL
  Filled 2019-03-19 (×2): qty 1

## 2019-03-19 MED ORDER — DEXAMETHASONE SODIUM PHOSPHATE 4 MG/ML IJ SOLN
INTRAMUSCULAR | Status: DC | PRN
  Administered 2019-03-19: 4 mg via INTRAVENOUS

## 2019-03-19 MED ORDER — LIDOCAINE-EPINEPHRINE 0.5 %-1:200000 IJ SOLN
INTRAMUSCULAR | Status: DC | PRN
  Administered 2019-03-19: 7 mL

## 2019-03-19 MED ORDER — SEVOFLURANE IN SOLN
RESPIRATORY_TRACT | Status: AC
  Filled 2019-03-19: qty 250

## 2019-03-19 MED ORDER — LIDOCAINE HCL (CARDIAC) PF 100 MG/5ML IV SOSY
PREFILLED_SYRINGE | INTRAVENOUS | Status: DC | PRN
  Administered 2019-03-19: 60 mg via INTRAVENOUS

## 2019-03-19 MED ORDER — DEXAMETHASONE SODIUM PHOSPHATE 10 MG/ML IJ SOLN
INTRAMUSCULAR | Status: AC
  Filled 2019-03-19: qty 1

## 2019-03-19 MED ORDER — HEMOSTATIC AGENTS (NO CHARGE) OPTIME
TOPICAL | Status: DC | PRN
  Administered 2019-03-19: 1 via TOPICAL

## 2019-03-19 MED ORDER — LACTATED RINGERS IV SOLN
INTRAVENOUS | Status: DC
  Administered 2019-03-19 (×3): via INTRAVENOUS

## 2019-03-19 MED ORDER — GLYCOPYRROLATE 0.2 MG/ML IJ SOLN
INTRAMUSCULAR | Status: DC | PRN
  Administered 2019-03-19: 0.1 mg via INTRAVENOUS

## 2019-03-19 MED ORDER — MIDAZOLAM HCL 2 MG/2ML IJ SOLN
0.5000 mg | Freq: Once | INTRAMUSCULAR | Status: DC | PRN
  Administered 2019-03-19: 2 mg via INTRAVENOUS
  Filled 2019-03-19: qty 2

## 2019-03-19 MED ORDER — GLYCOPYRROLATE PF 0.2 MG/ML IJ SOSY
PREFILLED_SYRINGE | INTRAMUSCULAR | Status: AC
  Filled 2019-03-19: qty 1

## 2019-03-19 SURGICAL SUPPLY — 33 items
BLADE 15 SAFETY STRL DISP (BLADE) ×2 IMPLANT
BLADE SURG 15 STRL LF DISP TIS (BLADE) ×1 IMPLANT
BLADE SURG 15 STRL SS (BLADE) ×1
BNDG CONFORM 2 STRL LF (GAUZE/BANDAGES/DRESSINGS) ×2 IMPLANT
CLOTH BEACON ORANGE TIMEOUT ST (SAFETY) ×2 IMPLANT
COVER LIGHT HANDLE STERIS (MISCELLANEOUS) ×4 IMPLANT
COVER WAND RF STERILE (DRAPES) ×2 IMPLANT
ELECT REM PT RETURN 9FT ADLT (ELECTROSURGICAL) ×2
ELECTRODE REM PT RTRN 9FT ADLT (ELECTROSURGICAL) ×1 IMPLANT
GAUZE 4X4 16PLY RFD (DISPOSABLE) ×2 IMPLANT
GAUZE SPONGE 4X4 12PLY STRL (GAUZE/BANDAGES/DRESSINGS) ×2 IMPLANT
GLOVE BIO SURGEON STRL SZ7 (GLOVE) ×2 IMPLANT
GLOVE BIOGEL PI IND STRL 7.0 (GLOVE) ×2 IMPLANT
GLOVE BIOGEL PI IND STRL 9 (GLOVE) ×1 IMPLANT
GLOVE BIOGEL PI INDICATOR 7.0 (GLOVE) ×2
GLOVE BIOGEL PI INDICATOR 9 (GLOVE) ×1
GLOVE ECLIPSE 9.0 STRL (GLOVE) ×2 IMPLANT
GOWN SPEC L3 XXLG W/TWL (GOWN DISPOSABLE) ×2 IMPLANT
GOWN STRL REUS W/TWL LRG LVL3 (GOWN DISPOSABLE) ×2 IMPLANT
INST SET MAJOR GENERAL (KITS) ×2 IMPLANT
KIT TURNOVER KIT A (KITS) ×2 IMPLANT
MANIFOLD NEPTUNE II (INSTRUMENTS) ×2 IMPLANT
NEEDLE HYPO 25X1 1.5 SAFETY (NEEDLE) ×2 IMPLANT
PACK ABDOMINAL MAJOR (CUSTOM PROCEDURE TRAY) ×2 IMPLANT
PAD ABD 5X9 TENDERSORB (GAUZE/BANDAGES/DRESSINGS) ×2 IMPLANT
PAD ARMBOARD 7.5X6 YLW CONV (MISCELLANEOUS) ×2 IMPLANT
SET BASIN LINEN APH (SET/KITS/TRAYS/PACK) ×2 IMPLANT
SOL PREP PROV IODINE SCRUB 4OZ (MISCELLANEOUS) ×2 IMPLANT
SPONGE LAP 18X18 RF (DISPOSABLE) ×2 IMPLANT
STRIP CLOSURE SKIN 1/2X4 (GAUZE/BANDAGES/DRESSINGS) ×4 IMPLANT
SYR BULB IRRIGATION 50ML (SYRINGE) ×2 IMPLANT
TAPE PAPER 2X10 WHT MICROPORE (GAUZE/BANDAGES/DRESSINGS) ×2 IMPLANT
TOWEL OR 17X26 4PK STRL BLUE (TOWEL DISPOSABLE) ×2 IMPLANT

## 2019-03-19 SURGICAL SUPPLY — 37 items
BNDG CONFORM 2 STRL LF (GAUZE/BANDAGES/DRESSINGS) ×2 IMPLANT
CLOTH BEACON ORANGE TIMEOUT ST (SAFETY) ×2 IMPLANT
COVER LIGHT HANDLE STERIS (MISCELLANEOUS) ×4 IMPLANT
COVER WAND RF STERILE (DRAPES) ×2 IMPLANT
DECANTER SPIKE VIAL GLASS SM (MISCELLANEOUS) ×2 IMPLANT
DRAPE PROXIMA HALF (DRAPES) ×4 IMPLANT
ELECT REM PT RETURN 9FT ADLT (ELECTROSURGICAL) ×2
ELECTRODE REM PT RTRN 9FT ADLT (ELECTROSURGICAL) ×1 IMPLANT
GAUZE PACKING 2X5 YD STRL (GAUZE/BANDAGES/DRESSINGS) ×2 IMPLANT
GAUZE SPONGE 4X4 12PLY STRL (GAUZE/BANDAGES/DRESSINGS) ×2 IMPLANT
GLOVE BIOGEL PI IND STRL 7.0 (GLOVE) ×4 IMPLANT
GLOVE BIOGEL PI IND STRL 9 (GLOVE) ×1 IMPLANT
GLOVE BIOGEL PI INDICATOR 7.0 (GLOVE) ×4
GLOVE BIOGEL PI INDICATOR 9 (GLOVE) ×1
GLOVE ECLIPSE 9.0 STRL (GLOVE) ×4 IMPLANT
GOWN SPEC L3 XXLG W/TWL (GOWN DISPOSABLE) ×4 IMPLANT
GOWN STRL REUS W/TWL LRG LVL3 (GOWN DISPOSABLE) ×2 IMPLANT
HEMOSTAT ARISTA ABSORB 1G (HEMOSTASIS) ×2 IMPLANT
INST SET MINOR GENERAL (KITS) ×2 IMPLANT
KIT TURNOVER KIT A (KITS) ×2 IMPLANT
MANIFOLD NEPTUNE II (INSTRUMENTS) ×2 IMPLANT
NEEDLE HYPO 21X1.5 SAFETY (NEEDLE) ×2 IMPLANT
NS IRRIG 1000ML POUR BTL (IV SOLUTION) ×2 IMPLANT
PACK ABDOMINAL MAJOR (CUSTOM PROCEDURE TRAY) ×2 IMPLANT
PAD ABD 5X9 TENDERSORB (GAUZE/BANDAGES/DRESSINGS) ×2 IMPLANT
PAD ARMBOARD 7.5X6 YLW CONV (MISCELLANEOUS) ×2 IMPLANT
PENCIL HANDSWITCHING (ELECTRODE) ×2 IMPLANT
SET BASIN LINEN APH (SET/KITS/TRAYS/PACK) ×2 IMPLANT
SPONGE LAP 18X18 RF (DISPOSABLE) ×4 IMPLANT
SUT MON AB 0 CT1 (SUTURE) ×2 IMPLANT
SUT PROLENE 0 CT 1 30 (SUTURE) ×4 IMPLANT
SYR BULB IRRIGATION 50ML (SYRINGE) ×2 IMPLANT
SYR CONTROL 10ML LL (SYRINGE) ×2 IMPLANT
TAPE PAPER 2X10 WHT MICROPORE (GAUZE/BANDAGES/DRESSINGS) ×2 IMPLANT
TOWEL OR 17X26 4PK STRL BLUE (TOWEL DISPOSABLE) ×4 IMPLANT
TRAY FOLEY W/BAG SLVR 16FR (SET/KITS/TRAYS/PACK) ×1
TRAY FOLEY W/BAG SLVR 16FR ST (SET/KITS/TRAYS/PACK) ×1 IMPLANT

## 2019-03-19 NOTE — TOC Transition Note (Signed)
Transition of Care [facility]) - CM/SW Discharge Note   Patient Details  Name: Kathleen Patrick MRN: [id number] Date of Birth: 1991-02-13  Transition of Care [facility]) CM/SW Contact:  Shade Flood, LCSW Phone Number: 03/19/2019, 3:24 PM   Clinical Narrative:     Received consult for pt needing wound vac and HH RN for dc. Discussed with MD and pt. Referred to [facility] for the wound vac and [facility] for [facility] RN at pt request. Per MD, vac will be placed tomorrow AM and then pt will dc home. HH will follow up with pt Monday. Phone number for [facility] placed on AVS for pt's reference if she needs them prior to Monday.  There are no other TOC needs anticipated for dc. Weekend TOC will be available if needed.  Final next level of care: [place] Barriers to Discharge: Continued Medical Work up   Patient Goals and CMS Choice        Discharge Placement                       Discharge Plan and Services                          HH Arranged: RN [facility] Agency: [place] [facility]) Date [place]: 03/19/19 Time [place]: 1520 Representative spoke with at [place]: Leda Quail  Social Determinants of Health (SDOH) Interventions     Readmission Risk Interventions No flowsheet data found.

## 2019-03-19 NOTE — Anesthesia Preprocedure Evaluation (Signed)
Anesthesia Evaluation  Patient identified by MRN, date of birth, ID band Patient awake    Reviewed: Allergy & Precautions, NPO status , Patient's Chart, lab work & pertinent test results  Airway Mallampati: II  TM Distance: >3 FB Neck ROM: Full    Dental no notable dental hx. (+) Teeth Intact   Pulmonary neg pulmonary ROS, Current Smoker,    Pulmonary exam normal breath sounds clear to auscultation       Cardiovascular hypertension, negative cardio ROS Normal cardiovascular exam Rhythm:Regular Rate:Normal     Neuro/Psych negative neurological ROS  negative psych ROS   GI/Hepatic negative GI ROS, Neg liver ROS,   Endo/Other  negative endocrine ROSdiabetes  Renal/GU negative Renal ROS  negative genitourinary   Musculoskeletal negative musculoskeletal ROS (+)   Abdominal   Peds negative pediatric ROS (+)  Hematology negative hematology ROS (+)   Anesthesia Other Findings   Reproductive/Obstetrics negative OB ROS                             Anesthesia Physical Anesthesia Plan  ASA: II and emergent  Anesthesia Plan: General   Post-op Pain Management:    Induction: Intravenous  PONV Risk Score and Plan: Dexamethasone, Ondansetron and Treatment may vary due to age or medical condition  Airway Management Planned: Oral ETT  Additional Equipment:   Intra-op Plan:   Post-operative Plan: Extubation in OR  Informed Consent: I have reviewed the patients History and Physical, chart, labs and discussed the procedure including the risks, benefits and alternatives for the proposed anesthesia with the patient or authorized representative who has indicated his/her understanding and acceptance.     Dental advisory given  Plan Discussed with: CRNA  Anesthesia Plan Comments: (Plan Full PPE GETA Had orals in PACU will RSI)        Anesthesia Quick Evaluation

## 2019-03-19 NOTE — Progress Notes (Signed)
Patient ID: Kathleen Patrick, female   DOB: 1991-02-06, 28 y.o.   MRN: [id number] Consult to case management made, will arrange wound vac for placement this pm or in a.m. for home use.

## 2019-03-19 NOTE — Anesthesia Preprocedure Evaluation (Addendum)
Anesthesia Evaluation  Patient identified by MRN, date of birth, ID band Patient awake    Reviewed: Allergy & Precautions, NPO status , Patient's Chart, lab work & pertinent test results  Airway Mallampati: II  TM Distance: >3 FB Neck ROM: Full    Dental no notable dental hx. (+) Teeth Intact   Pulmonary neg pulmonary ROS, Current Smoker,    Pulmonary exam normal breath sounds clear to auscultation       Cardiovascular Exercise Tolerance: Good hypertension, Pt. on medications and Pt. on home beta blockers negative cardio ROS Normal cardiovascular examI Rhythm:Regular Rate:Normal     Neuro/Psych PSYCHIATRIC DISORDERS Depression negative neurological ROS     GI/Hepatic negative GI ROS, Neg liver ROS,   Endo/Other  diabetesMorbid obesity  Renal/GU negative Renal ROS  negative genitourinary   Musculoskeletal negative musculoskeletal ROS (+)   Abdominal   Peds negative pediatric ROS (+)  Hematology negative hematology ROS (+) anemia ,   Anesthesia Other Findings   Reproductive/Obstetrics negative OB ROS                            Anesthesia Physical Anesthesia Plan  ASA: III  Anesthesia Plan: General   Post-op Pain Management:    Induction: Intravenous  PONV Risk Score and Plan: 2 and Dexamethasone and Ondansetron  Airway Management Planned: Oral ETT  Additional Equipment:   Intra-op Plan:   Post-operative Plan: Extubation in OR  Informed Consent: I have reviewed the patients History and Physical, chart, labs and discussed the procedure including the risks, benefits and alternatives for the proposed anesthesia with the patient or authorized representative who has indicated his/her understanding and acceptance.     Dental advisory given  Plan Discussed with: CRNA  Anesthesia Plan Comments: (Plan Full PPE use  Plan GETA-d/w pt -WTP same )       Anesthesia Quick  Evaluation

## 2019-03-19 NOTE — Brief Op Note (Signed)
03/19/2019  8:40 AM  PATIENT:  Kathleen Patrick  28 y.o. female  PRE-OPERATIVE DIAGNOSIS:  Fasciitis, cellulitis umbilicus incision site  POST-OPERATIVE DIAGNOSIS: Necrotizing fasciitis Umbilicus, and cellulitis of the incision site  PROCEDURE:  Procedure(s): DEBRIDEMENT ABDOMINAL WOUND (N/A)  SURGEON:  Surgeon(s) and Role:    Jonnie Kind, MD - Primary  PHYSICIAN ASSISTANT:   ASSISTANTS: Bonney Roussel, CST  ANESTHESIA:   general  EBL:  5 mL   BLOOD ADMINISTERED:none  DRAINS: none, with 2 inch Kling wet-to-dry dressing placed in incision, covered with 4 x 4's opened and ABD pad  LOCAL MEDICATIONS USED:  NONE  SPECIMEN:  Source of Specimen:  Tissue debridement  DISPOSITION OF SPECIMEN:  PATHOLOGY  COUNTS:  YES  TOURNIQUET:  * No tourniquets in log *  DICTATION: .Dragon Dictation  PLAN OF CARE: Inpatient case  PATIENT DISPOSITION:  PACU - hemodynamically stable.   Delay start of Pharmacological VTE agent (>24hrs) due to surgical blood loss or risk of bleeding: yes Indications: Continued malodorous discharge and erythema surrounding incision site of prior postpartum tubal ligation Details of procedure: Patient was taken the operating room prepped and draped for abdominal procedure with timeout conducted and procedure confirmed by surgical team.  Dressing had been removed during the prepping and draping.  Continued significant malodor was noted.  The incision was extended to a width of 7 cm in a elliptical incision around the umbilicus and extending laterally on each side in a symmetric fashion the subcutaneous fatty defect was opened laterally to make the incision more elliptical.  The depth of the incision was measured at 4.5 cm during the case.  The base of the incision continued to have necrotic odorous tissue.  The fatty tissues were trimmed of the firm fibrotic surface of the infection site to allow improved access down to the level of the fascia.  The 0 Vicryl  that was noted in the base of the incision was left in place to keep fascial closure but the surrounding tissues were carefully trimmed of loose areas of necrotic fascia.  At no time was the abdominal cavity entered and palpation of the base seemed quite firm.  The patient will be at increased risk of umbilical hernia postop.  Debridement of the necrotic tissue in the base was done until edematous but vascularized tissue could be identified.  On the inferior side of the incision the fatty tissue was contoured just beneath the skin to allow for skin edges to be more likely to symmetrically reapproximate postop.  A 5 mm strip of the skin that was necrotic on the umbilical stalk side of the incision was trimmed back to well adequately vascularized tissues..  T The the lateral aspects of the fatty tissue was shaved to shape the subcutaneous defect so that future wound VAC placement will be easier and skin edge approximation more likely by terminating the developing thickened  tissue in the subcu fatty space. The base of the defect was palpated again and it was confirmed that there was no peritoneal entry and that the base of it felt intact. Moistened saline soaked 2 inch Kling was packed in the wound, 4 x 4's placed and an ABD pad placed.  This will be changed tomorrow and converted to a wound VAC for outpatient care  Sponge and needle counts were correct throughout.  No needles were used

## 2019-03-19 NOTE — Progress Notes (Addendum)
Subjective: hosp day 3 for wound cellulitis fasciitis after PP tubal ligation.  Patient reports tolerating PO, + flatus and no problems voiding.  She is able to move better. BID dressing changes and antibiotics have helped, pt feels better, and can move about with less pain.  Objective: I have reviewed patient's vital signs, intake and output, medications and labs.  General: alert, cooperative, no distress and improved color and demeanor Resp: clear to auscultation bilaterally GI: soft, non-tender; bowel sounds normal; no masses,  no organomegaly and incision: stil malodor. dressing is not soaked overnight. skin is less red. legs nontender. Last Lovenox 40 mg at 8 pm.  Microbiology: wound culture from office: gram + cocci , culture pending. CBC Latest Ref Rng & Units 03/17/2019 03/12/2019 03/11/2019  WBC 4.0 - 10.5 K/uL 11.9(H) 8.3 11.0(H)  Hemoglobin 12.0 - 15.0 g/dL 9.5(L) 9.0(L) 10.9(L)  Hematocrit 36.0 - 46.0 % 30.6(L) 27.9(L) 33.2(L)  Platelets 150 - 400 K/uL 148(L) 95(L) 121(L)    Assessment/Plan: Wound fasciitis, cellulitis. On Vancomycin and Clindamycin for therapy To or this AM for debridement of necrotic edges of tissue, then will evaluate for outpt management.  LOS: 2 days    Jonnie Kind 03/19/2019, 7:24 AM

## 2019-03-19 NOTE — Anesthesia Procedure Notes (Signed)
Procedure Name: Intubation Date/Time: 03/19/2019 11:14 AM Performed by: Georgeanne Nim, CRNA Pre-anesthesia Checklist: Patient identified, Emergency Drugs available, Suction available, Patient being monitored and Timeout performed Patient Re-evaluated:Patient Re-evaluated prior to induction Oxygen Delivery Method: Circle system utilized Preoxygenation: Pre-oxygenation with 100% oxygen Induction Type: IV induction, Rapid sequence and Cricoid Pressure applied Laryngoscope Size: Mac and 4 Grade View: Grade I Tube size: 7.0 mm Number of attempts: 1 Airway Equipment and Method: Stylet Placement Confirmation: ETT inserted through vocal cords under direct vision,  positive ETCO2,  breath sounds checked- equal and bilateral and CO2 detector Secured at: 22 cm Tube secured with: Tape Dental Injury: Teeth and Oropharynx as per pre-operative assessment  Comments: Intubated by Lovell Sheehan CRNA

## 2019-03-19 NOTE — Transfer of Care (Signed)
Immediate Anesthesia Transfer of Care Note  Patient: Kathleen Patrick  Procedure(s) Performed: DEBRIDEMENT ABDOMINAL WOUND (N/A Abdomen)  Patient Location: PACU  Anesthesia Type:General  Level of Consciousness: awake, oriented and patient cooperative  Airway & Oxygen Therapy: Patient Spontanous Breathing and Patient connected to face mask oxygen  Post-op Assessment: Report given to RN and Post -op Vital signs reviewed and stable  Post vital signs: Reviewed and stable  Last Vitals:  Vitals Value Taken Time  BP 130/81 03/19/19 0845  Temp    Pulse 86 03/19/19 0847  Resp 21 03/19/19 0847  SpO2 100 % 03/19/19 0847  Vitals shown include unvalidated device data.  Last Pain:  Vitals:   03/19/19 0635  TempSrc: Oral  PainSc: 0-No pain      Patients Stated Pain Goal: 4 (96/22/29 7989)  Complications: No apparent anesthesia complications

## 2019-03-19 NOTE — Anesthesia Procedure Notes (Signed)
Procedure Name: Intubation Date/Time: 03/19/2019 7:40 AM Performed by: Georgeanne Nim, CRNA Pre-anesthesia Checklist: Patient identified, Emergency Drugs available, Suction available, Patient being monitored and Timeout performed Patient Re-evaluated:Patient Re-evaluated prior to induction Oxygen Delivery Method: Circle system utilized Preoxygenation: Pre-oxygenation with 100% oxygen Induction Type: IV induction, Rapid sequence and Cricoid Pressure applied Laryngoscope Size: Mac and 4 Grade View: Grade I Tube size: 7.0 mm Number of attempts: 1 Airway Equipment and Method: Stylet Placement Confirmation: ETT inserted through vocal cords under direct vision,  positive ETCO2,  CO2 detector and breath sounds checked- equal and bilateral Secured at: 22 cm Tube secured with: Tape Dental Injury: Teeth and Oropharynx as per pre-operative assessment

## 2019-03-19 NOTE — Op Note (Signed)
Please see the brief operative note for surgical details. I anticipate overnight observation and wound VAC placement on the floor tomorrow discharge for home health to manage wound VAC with intermittent visits to my office

## 2019-03-19 NOTE — Transfer of Care (Signed)
Immediate Anesthesia Transfer of Care Note  Patient: Kathleen Patrick  Procedure(s) Performed: WOUND EXPLORATION (N/A Abdomen)  Patient Location: PACU  Anesthesia Type:General  Level of Consciousness: awake and patient cooperative  Airway & Oxygen Therapy: Patient Spontanous Breathing and Patient connected to face mask oxygen  Post-op Assessment: Report given to RN and Post -op Vital signs reviewed and stable  Post vital signs: Reviewed and stable  Last Vitals:  Vitals Value Taken Time  BP 117/87 03/19/19 1200  Temp 36.5 C 03/19/19 1200  Pulse 64 03/19/19 1204  Resp 12 03/19/19 1204  SpO2 100 % 03/19/19 1204  Vitals shown include unvalidated device data.  Last Pain:  Vitals:   03/19/19 1049  TempSrc:   PainSc: 8       Patients Stated Pain Goal: 4 (69/79/48 0165)  Complications: No apparent anesthesia complications

## 2019-03-19 NOTE — Brief Op Note (Signed)
03/19/2019  11:53 AM  PATIENT:  Kathleen Patrick  28 y.o. female  PRE-OPERATIVE DIAGNOSIS:  Postop bleeding after wound debridement for fasciitis/ cellulitis  POST-OPERATIVE DIAGNOSIS:  Postop bleeding  PROCEDURE:  Procedure(s): WOUND EXPLORATION (N/A) , with cautery of capillary bleeding  SURGEON:  Surgeon(s) and Role:    Jonnie Kind, MD - Primary  PHYSICIAN ASSISTANT:   ASSISTANTS: wendy Kendrick CST   ANESTHESIA:   general  EBL:  75 mL   BLOOD ADMINISTERED:none  DRAINS: Urinary Catheter (Foley)   LOCAL MEDICATIONS USED:  NONE  SPECIMEN:  No Specimen  DISPOSITION OF SPECIMEN:  N/A  COUNTS:  YES  TOURNIQUET:  * No tourniquets in log *  DICTATION: .Dragon Dictation  PLAN OF CARE: has inpatient admission  PATIENT DISPOSITION:  PACU - hemodynamically stable.   Delay start of Pharmacological VTE agent (>24hrs) due to surgical blood loss or risk of bleeding: not applicable  Indications: I was called at 10:07 AM from recovery room indicating that the dressing has been reinforced due to bleeding from the incision.  Upon my arrival approximately 10 15 to 1020 it was obvious that there was active bleeding from the incision.  The patient was comfortable in recovery room with dressing removed with estimated blood loss identified at 300 to 400 cc.  Vital signs remained stable with pulse in the 70s with no increase in pulse pressure the wound packing was removed but the bleeding was sufficiently active that I could not adequately identify the site of the bleeding..  This was despite efforts to infiltrate around it with local with epinephrine after prepping and draping of the skin.  Decision was made to return the OR for adequate exposure and treatment options.  Anesthesia and our team were notified, with tranexamic acid ordered for Intra-Op administration.  Pressure dressing was reapplied until patient can be taken to the OR   Details of procedure patient was taken the  operating room prepped and draped for umbilical procedure.  Foley catheter had been placed.  Timeout was conducted.  The patient had received antibiotics prior to the first procedure and did not require repeat, as she was on therapeutic vancomycin and clindamycin.  Pressure dressing was removed and during the time that it took to draped the procedure patient had approximately 30 cc of blood well up in the previous incision, filling it completely.  There were clots in the incision. Wound was evacuated and inspected.  There were multiple tiny spots of capillary bleeding on the inferior aspects of the incision particular in the area just under the skin where some of the later work is been done later in the procedure.  With suction to remove any active blood and pickups to remove clots we completely evacuated the previous defect and inspected all the surfaces.  Several areas required point cautery for tiny capillary oozing in the subcutaneous fatty space.  The bottom of the incision was inspected carefully and palpated and remained intact.  We did not enter the abdominal cavity.  The fascial closure suture appeared to be intact in place In the lateral aspects of the incision there were several areas where there was a small bit of capillary oozing as we moved manipulated things to carefully inspect every surface.  Tranexamic acid 1 g was administered intravenous. Upon completion of the procedure we simply applied pressure for 3 minutes and then reinspected.  1 g of Arista was applied to the wound Wound was inspected further and remained hemostatic. A 2  inch Kling was moistened and squeezed out completely, and packed in the incision surfaces as before the complication.Marland Kitchen.  4 x 4's were placed over the skin and ABD dressing in place.  Prior to taping the edges of the incision wound superficially inspected at the without removing the dressing, and there was no bleeding noted.  Patient will go to recovery room in  stable condition  Review of the chart indicates that the patient had received her Lovenox shot yesterday later in the day instead of at 8 AM due to her late admission the day before.  Patient was quite hemostatic during the case itself and no point cautery was necessary during the initial procedure .the patient had additionally received Toradol 30 mg IV upon completion of the case for pain management assistance.  The timing of this coincides almost exactly with the time of the onset of bleeding and with my belief that this Toradol had a deleterious effect on the clotting processes. At the end of the case the patient went to recovery room in stable condition

## 2019-03-19 NOTE — Op Note (Signed)
Please see the brief operative note for surgical details 

## 2019-03-19 NOTE — Anesthesia Postprocedure Evaluation (Signed)
Anesthesia Post Note  Patient: Kathleen Patrick  Procedure(s) Performed: DEBRIDEMENT ABDOMINAL WOUND (N/A Abdomen)  Patient location during evaluation: PACU Anesthesia Type: General Level of consciousness: awake Pain management: pain level controlled Vital Signs Assessment: post-procedure vital signs reviewed and stable Respiratory status: spontaneous breathing Cardiovascular status: stable Postop Assessment: no apparent nausea or vomiting Anesthetic complications: no     Last Vitals:  Vitals:   03/19/19 0843 03/19/19 0845  BP: 131/86 130/81  Pulse: 87 87  Resp: 12 18  Temp:    SpO2: 100% 96%    Last Pain:  Vitals:   03/19/19 0635  TempSrc: Oral  PainSc: 0-No pain                 Everette Rank

## 2019-03-19 NOTE — Anesthesia Postprocedure Evaluation (Signed)
Anesthesia Post Note  Patient: Kathleen Patrick  Procedure(s) Performed: WOUND EXPLORATION (N/A Abdomen)  Patient location during evaluation: PACU Anesthesia Type: General Level of consciousness: awake Pain management: pain level controlled Vital Signs Assessment: post-procedure vital signs reviewed and stable Respiratory status: spontaneous breathing Cardiovascular status: stable Postop Assessment: no apparent nausea or vomiting Anesthetic complications: no     Last Vitals:  Vitals:   03/19/19 1100 03/19/19 1200  BP: 125/82   Pulse: 72   Resp: 16   Temp:  36.5 C  SpO2: 95%     Last Pain:  Vitals:   03/19/19 1200  TempSrc:   PainSc: 0-No pain                 Everette Rank

## 2019-03-20 LAB — WOUND CULTURE

## 2019-03-20 LAB — CBC
HCT: 25.7 % — ABNORMAL LOW (ref 36.0–46.0)
Hemoglobin: 7.8 g/dL — ABNORMAL LOW (ref 12.0–15.0)
MCH: 27.1 pg (ref 26.0–34.0)
MCHC: 30.4 g/dL (ref 30.0–36.0)
MCV: 89.2 fL (ref 80.0–100.0)
Platelets: 165 10*3/uL (ref 150–400)
RBC: 2.88 MIL/uL — ABNORMAL LOW (ref 3.87–5.11)
RDW: 12.8 % (ref 11.5–15.5)
WBC: 7.7 10*3/uL (ref 4.0–10.5)
nRBC: 0 % (ref 0.0–0.2)

## 2019-03-20 MED ORDER — DOXYCYCLINE HYCLATE 100 MG PO TABS: 100.0000 mg | ORAL_TABLET | Freq: Two times a day (BID) | ORAL | 0 refills | Status: DC

## 2019-03-20 MED ORDER — TRAMADOL HCL 50 MG PO TABS: 50.0000 mg | ORAL_TABLET | Freq: Four times a day (QID) | ORAL | 0 refills | Status: DC | PRN

## 2019-03-20 MED ORDER — FENTANYL CITRATE (PF) 100 MCG/2ML IJ SOLN
INTRAMUSCULAR | Status: AC
  Filled 2019-03-20: qty 2

## 2019-03-20 MED ORDER — FERROUS SULFATE 325 (65 FE) MG PO TBEC: 325.0000 mg | DELAYED_RELEASE_TABLET | Freq: Three times a day (TID) | ORAL | 3 refills | Status: DC

## 2019-03-20 MED ORDER — FENTANYL CITRATE (PF) 100 MCG/2ML IJ SOLN
100.0000 ug | Freq: Once | INTRAMUSCULAR | Status: AC
  Administered 2019-03-20: 100 ug via INTRAVENOUS

## 2019-03-20 MED ORDER — IBUPROFEN 600 MG PO TABS: 600.0000 mg | ORAL_TABLET | Freq: Four times a day (QID) | ORAL | 0 refills | Status: DC | PRN

## 2019-03-20 NOTE — Progress Notes (Signed)
Attending RN at bedside when MD placed wound vac. Pt tolerated well. Vitals WNL. Kathleen Patrick catheter and IV removed per MD order. Awaiting D/C orders from MD. Will continue to monitor.

## 2019-03-20 NOTE — TOC Transition Note (Signed)
Transition of Care [facility]) - CM/SW Discharge Note   Patient Details  Name: Kathleen Patrick MRN: [id number] Date of Birth: 1991/09/29  Transition of Care [facility]) CM/SW Contact:  Latanya Maudlin, RN Phone Number: 03/20/2019, 10:22 AM   Clinical Narrative:   Patient to be discharged per MD order. Orders in place for home health services. Previous RNCM had given referral to [facility] for nursing. Patient also had wound vac placed from [facility].Notified Corene Cornea from [facility] of pending discharge. Also checking with [facility] to make sure all vac supplies in place.      Final next level of care: Home w Home Health Services Barriers to Discharge: No Barriers Identified   Patient Goals and CMS Choice   CMS Medicare.gov Compare Post Acute Care list provided to:: Patient Choice offered to / list presented to : Patient  Discharge Placement                       Discharge Plan and Services                DME Arranged: Vac DME Agency: [facility] Date DME Agency Contacted: 03/20/19 Time DME Agency Contacted: 5732 Representative spoke with at DME Agency: [place]: RN [place] Agency: [place] ([place]) Date [place]: 03/20/19 Time [place]: 1022 Representative spoke with at [place]: [place] ([place]) Interventions     Readmission Risk Interventions Readmission Risk Prevention Plan 03/20/2019 03/19/2019  Post Dischage Appt Complete -  Medication Screening Complete Complete  Transportation Screening Complete Complete  Some recent data might be hidden

## 2019-03-20 NOTE — Discharge Instructions (Signed)
[facility] Nurse will see you at home on Monday. If you need them before Monday, you can call them at [phone] and they will arrange for an on call nurse to come see you.  Dr Johnnye Sima cell # [phone]

## 2019-03-20 NOTE — Progress Notes (Signed)
1 Day Post-Op Procedure(s) (LRB): WOUND EXPLORATION (N/A) and debridement.  Subjective: Patient reports incisional pain, tolerating PO and no problems voiding.    Objective: I have reviewed patient's vital signs, intake and output, labs and microbiology.  General: alert, cooperative and mild distress Resp: clear to auscultation bilaterally GI: soft, non-tender; bowel sounds normal; no masses,  no organomegaly and incision: clean, dry and wound vac placed. CBC Latest Ref Rng & Units 03/20/2019 03/17/2019 03/12/2019  WBC 4.0 - 10.5 K/uL 7.7 11.9(H) 8.3  Hemoglobin 12.0 - 15.0 g/dL 7.8(L) 9.5(L) 9.0(L)  Hematocrit 36.0 - 46.0 % 25.7(L) 30.6(L) 27.9(L)  Platelets 150 - 400 K/uL 165 148(L) 95(L)   Erythema has dramatically improved. Assessment: s/p Procedure(s): WOUND EXPLORATION (N/A): stable and able to be d/c  Plan: Discharge home foley removed.  LOS: 3 days    Jonnie Kind 03/20/2019, 8:52 AM

## 2019-03-20 NOTE — Discharge Summary (Signed)
Physician Discharge Summary  Patient ID: Kathleen Patrick MRN: [id number] DOB/AGE: 05-Jul-1991 28 y.o.  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  This was opened in the office and showed necrotic tissue consistent with a small area of necrotizing fasciitis.  She was admitted for antibiotic therapy and dressing changes.  Wound cultures were obtained in the office and are pending at the time of discharge. Admitting hemoglobin was 9.5 white count 11,900 with 82 neutrophils.  She was afebrile. Patient had twice daily dressing changes for 24 hours was taken to the operating room on 03/19/2023 debridement down to the fascia was performed.  EBL at the initial debridement was estimated at 5 to 15 cc.Marland Kitchen  She was given Toradol IV at the end of the case.  She had impressive postoperative bleeding noted in the recovery room requiring turn to the OR to achieve hemostasis by multiple point cautery of small capillary bleeding as well as utilization of TXA and Arista.  See operative note for details.  EBL estimated at 250 to 400 cc prior to return the OR She was kept overnight remained stable and hemostatic, with wound VAC placed the following day, 03/20/2019 and discharge at that time.  Consults: None  Significant Diagnostic Studies: labs:  CBC Latest Ref Rng & Units 03/20/2019 03/17/2019 03/12/2019  WBC 4.0 - 10.5 K/uL 7.7 11.9(H) 8.3  Hemoglobin 12.0  - 15.0 g/dL 7.8(L) 9.5(L) 9.0(L)  Hematocrit 36.0 - 46.0 % 25.7(L) 30.6(L) 27.9(L)  Platelets 150 - 400 K/uL 165 148(L) 95(L)     Treatments: surgery: Debridement of umbilical surgical site, and return the OR for bleeding control and wound VAC placement on day of discharge  Discharge Exam: Blood pressure (!) 121/97, pulse (!) 113, temperature 98.5 F (36.9 C), temperature source Oral, resp. rate 18, height 5\' 4"  (1.626 m), weight 107.5 kg, last menstrual period 06/22/2018, SpO2 98 %, not currently breastfeeding. General appearance: alert, cooperative and no distress Head: Normocephalic, without obvious abnormality, atraumatic GI: Mild tenderness surrounding the incision.  Resolution of the diffuse erythema noted and admitting photographs Skin: Skin color, texture, turgor normal. No rashes or lesions or 7 cm transverse subumbilical incision with wound VAC in place Incision/Wound:  Disposition: Discharge disposition: 01-Home or Self Care       Discharge Instructions    Call MD for:  redness, tenderness, or signs of infection (pain, swelling, redness, odor or green/yellow discharge around incision site)   Complete by: As directed    Call MD for:  severe uncontrolled pain   Complete by: As directed    Call MD for:  temperature >100.4   Complete by: As directed    Diet - low sodium heart healthy   Complete by: As directed    Increase activity slowly   Complete by: As directed       Follow-up Information    Jonnie Kind, MD Follow up in 1 week(s).   Specialties: Obstetrics and Gynecology, Radiology Contact information: [place] [id number] [phone]           Signed: Tilda BurrowJohn V Daylene Vandenbosch 03/20/2019, 9:14 AM

## 2019-03-21 NOTE — Telephone Encounter (Signed)
Pt stable in hospital , no further bleeding

## 2019-03-22 ENCOUNTER — Encounter (HOSPITAL_COMMUNITY): Payer: Self-pay | Admitting: Obstetrics and Gynecology

## 2019-03-22 NOTE — Addendum Note (Signed)
Addendum  created 03/22/19 2244 by Mickel Baas, CRNA   [employer]ge Capt[employer] section accepted

## 2019-03-23 ENCOUNTER — Telehealth: Payer: Self-pay | Admitting: Obstetrics and Gynecology

## 2019-03-23 NOTE — Telephone Encounter (Signed)
Pt has been in the hospital and had a wound vac placed due to infection from her procedure to have her tubes tied. She states on the discharge papers she is to come in on Friday to have the wound vac checked. Pt has a [place] nurse coming out to check the wound vac and change dressings 3 times a week. Does she need to come in the office on Friday?

## 2019-03-24 NOTE — Telephone Encounter (Signed)
1.  Patient requests increase pain medicine to assist with dressing change.  We will first try taking ibuprofen and a second tramadol just before the dressing change on Friday 2.  Patient will have dressing changes through home health and have home health send photos of wound into "my chart "records 3.  Follow-up next week if necessary

## 2019-03-24 NOTE — Telephone Encounter (Signed)
Unable to reach patient. Phone number reaches [employer].

## 2019-03-31 ENCOUNTER — Telehealth: Payer: Self-pay | Admitting: Obstetrics and Gynecology

## 2019-03-31 NOTE — Telephone Encounter (Signed)
Pt states she will not be able to have her wound vac looked at this week in the office. 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 Requesting to speak with a nurse to see what Dr. Glo Herring suggest.

## 2019-03-31 NOTE — Telephone Encounter (Signed)
Kathleen Patrick reports that the wound is continuing to improve "it did not even hurt when they dressed it today.  Plans are for the wound VAC to be discontinued on Friday and then simply Neosporin and Telfa and dressing changes frequently.  Follow-up in the office in a couple weeks patient very happy with the results

## 2019-03-31 NOTE — Telephone Encounter (Signed)
I will call the patient

## 2019-04-07 ENCOUNTER — Encounter: Payer: Self-pay | Admitting: *Deleted

## 2019-04-08 ENCOUNTER — Other Ambulatory Visit: Payer: Self-pay

## 2019-04-08 ENCOUNTER — Ambulatory Visit (INDEPENDENT_AMBULATORY_CARE_PROVIDER_SITE_OTHER): Payer: Medicaid Other | Admitting: Women's Health

## 2019-04-08 ENCOUNTER — Encounter: Payer: Self-pay | Admitting: Women's Health

## 2019-04-08 DIAGNOSIS — Z8632 Personal history of gestational diabetes: Secondary | ICD-10-CM | POA: Insufficient documentation

## 2019-04-08 DIAGNOSIS — M726 Necrotizing fasciitis: Secondary | ICD-10-CM

## 2019-04-08 DIAGNOSIS — Z8759 Personal history of other complications of pregnancy, childbirth and the puerperium: Secondary | ICD-10-CM

## 2019-04-08 NOTE — Progress Notes (Signed)
TELEHEALTH VIRTUAL POSTPARTUM VISIT ENCOUNTER NOTE Patient name: Kathleen Patrick MRN [id number]  Date of birth: 1991-10-01  I connected with patient on 04/08/19 at 11:15 AM EDT by [facility] and verified that I am speaking with the correct person using two identifiers. Due to COVID-19 recommendations, pt is not currently in our office.    I discussed the limitations, risks, security and privacy concerns of performing an evaluation and management service by telephone and the availability of in person appointments. I also discussed with the patient that there may be a patient responsible charge related to this service. The patient expressed understanding and agreed to proceed.  Chief Complaint:   Postpartum Care  History of Present Illness:   Kathleen Patrick is a 28 y.o. [phone] Caucasian female being evaluated today for a postpartum visit. She is 4 weeks postpartum following a spontaneous vaginal delivery at 37.4 gestational weeks after IOL for GHTN. Anesthesia: epidural. Laceration: n/a.  She had pp BTL on 6/5.  I have fully reviewed the prenatal and intrapartum course. Pregnancy complicated by [facility] and D6QI. Postpartum course has been complicated by necrotizing fascitis umbilicus and cellulitis of incision site w/ debridement on 6/12 and then wound exploration w/ cautery of capillary bleeding on same day d/t bleeding. Bleeding thin lochia. Bowel function is normal. Bladder function is normal.  Patient is not sexually active. Last sexual activity: prior to birth of baby.  Contraception method is tubal ligation.  Last pap 09/02/18.  Results were normal .  No LMP recorded.  Baby's course has been uncomplicated. Baby is feeding by bottle.   Edinburgh Postpartum Depression Screening: negative Edinburgh Postnatal Depression Scale - 04/08/19 1038      Edinburgh Postnatal Depression Scale:  In the Past 7 Days   I have been able to laugh and see the funny side of things.  0    I have looked  forward with enjoyment to things.  0    I have blamed myself unnecessarily when things went wrong.  0    I have been anxious or worried for no good reason.  0    I have felt scared or panicky for no good reason.  0    Things have been getting on top of me.  0    I have been so unhappy that I have had difficulty sleeping.  0    I have felt sad or miserable.  0    I have been so unhappy that I have been crying.  0    The thought of harming myself has occurred to me.  0    Edinburgh Postnatal Depression Scale Total  0      Review of Systems:   Pertinent items are noted in HPI Denies Abnormal vaginal discharge w/ itching/odor/irritation, headaches, visual changes, shortness of breath, chest pain, abdominal pain, severe nausea/vomiting, or problems with urination or bowel movements. Pertinent History Reviewed:  Reviewed past medical,surgical, obstetrical and family history.  Reviewed problem list, medications and allergies. OB History  Gravida Para Term Preterm AB Living  4 4 3 1   4   SAB TAB Ectopic Multiple Live Births        0 4    # Outcome Date GA Lbr Len/2nd Weight Sex Delivery Anes PTL Lv  4 Term 03/12/19 [redacted]w[redacted]d  7 lb 1.4 oz (3.215 kg) F Vag-Spont EPI  LIV  3 Preterm 07/30/13 [redacted]w[redacted]d  6 lb (2.722 kg) M Vag-Spont EPI N LIV  2 Term 07/11/12 [redacted]w[redacted]d 278:53 /  00:01 6 lb 11.2 oz (3.039 kg) M Vag-Spont None N LIV  1 Term 08/31/10 [id number]  8 lb (3.629 kg) F Vag-Spont EPI N LIV     Complications: Preeclampsia   Physical Assessment:  There were no vitals filed for this visit.There is no height or weight on file to calculate BMI.       Physical Examination:  General:  Alert, oriented and cooperative.   Mental Status: Normal mood and affect perceived. Normal judgment and thought content.  Incision: healing well Rest of physical exam deferred due to type of encounter       No results found for this or any previous visit (from the past 24 hour(s)).  Assessment & Plan:  1) Postpartum exam 2)  4 wks s/p SVB w/ inpatient BTL 3) Bottlefeeding 4) Depression screening 5) S/P incision debridement for necrotizing fascitis and cellulitis of BTL incision> healing well, let us know if doesn't continue to improve, or worsens 6) GHTN during pregnancy> come next week for bp check w/ nurse 7) A1DM during pregnancy> A1C @ 12wks pp  Meds: No orders of the defined types were placed in this encounter.   I discussed the assessment and treatment plan with the patient. The patient was provided an opportunity to ask questions and all were answered. The patient agreed with the plan and demonstrated an understanding of the instructions.   The patient was advised to call back or seek an in-person evaluation/go to the ED for any concerning postpartum symptoms.  I provided 15 minutes of non-face-to-face time during this encounter.  Follow-up: Return for next week for bp check w/ nurse (will need to go to car), then 8wks for now .   No orders of the defined types were placed in this encounter.   Cheral MarkerKimberly R Eyonna Sandstrom CNM, [facility]NP-BC 04/08/2019 11:09 AM

## 2019-04-15 ENCOUNTER — Other Ambulatory Visit: Payer: Self-pay

## 2019-04-15 ENCOUNTER — Ambulatory Visit (INDEPENDENT_AMBULATORY_CARE_PROVIDER_SITE_OTHER): Payer: Medicaid Other | Admitting: *Deleted

## 2019-04-15 ENCOUNTER — Encounter: Payer: Self-pay | Admitting: *Deleted

## 2019-04-15 VITALS — BP 130/82 | HR 74

## 2019-04-15 DIAGNOSIS — Z0130 Encounter for examination of blood pressure without abnormal findings: Secondary | ICD-10-CM

## 2019-04-15 NOTE — Progress Notes (Signed)
Pt in for BP check. Feeling good. No headaches or blurry vision.

## 2019-06-03 ENCOUNTER — Other Ambulatory Visit: Payer: Medicaid Other

## 2019-06-07 ENCOUNTER — Other Ambulatory Visit: Payer: Medicaid Other

## 2020-10-24 IMAGING — CT CT ABDOMEN AND PELVIS WITH CONTRAST
2 of 5 series · 15 of 46 positions shown, 17 images · IV contrast (Isovue)
Comparison: None.

CLINICAL DATA: Status post vaginal delivery on 03/12/2019 with same
day tubal ligation.

EXAM:
CT ABDOMEN AND PELVIS WITH CONTRAST
TECHNIQUE: Multidetector CT imaging of the abdomen and pelvis was performed
using the standard protocol following bolus administration of
intravenous contrast.
CONTRAST:  30mL OMNIPAQUE IOHEXOL 300 MG/ML SOLN, 100mL OMNIPAQUE
IOHEXOL 300 MG/ML SOLN

[Series 2: axial st · axial · 0.78mm/px · z∈[+875,+1290]mm · 12 of 99 slices shown, 14 images]
[im 8/99  soft-tissue]
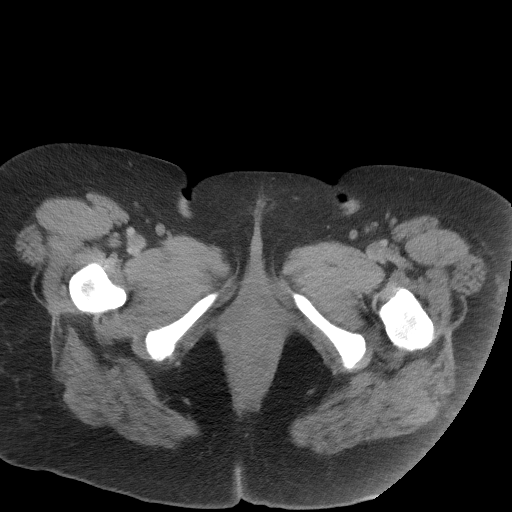
[im 8/99  bone]
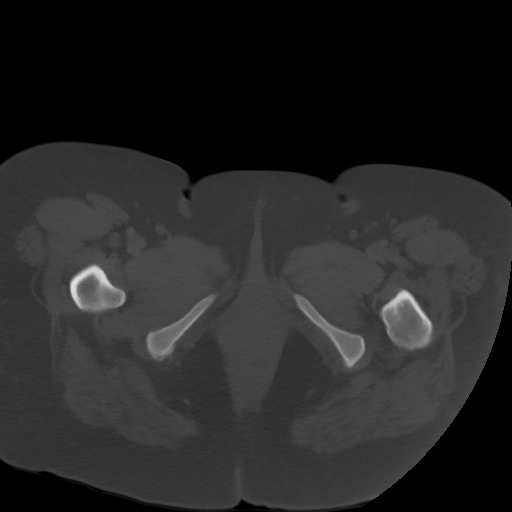
[im 16/99  soft-tissue]
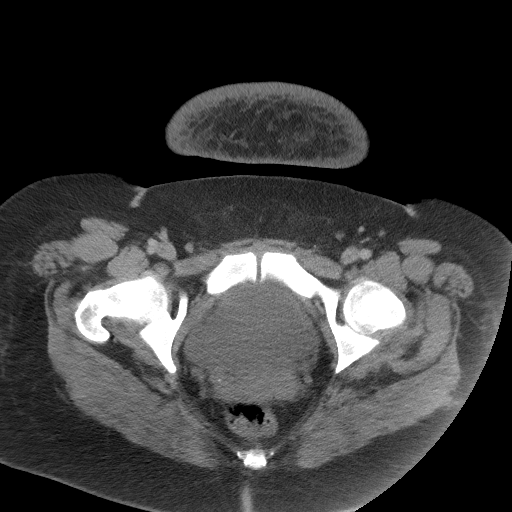
[im 23/99  soft-tissue]
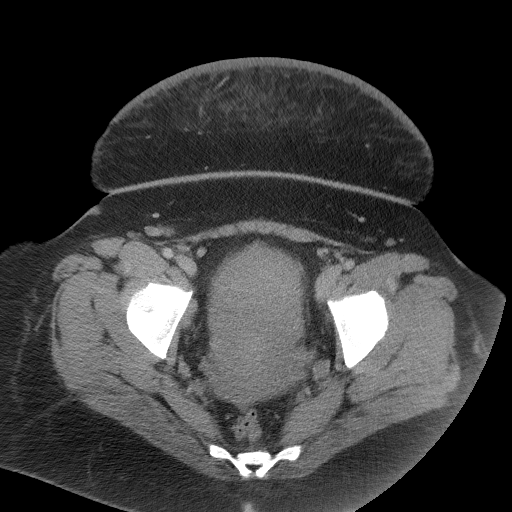
[im 31/99  soft-tissue]
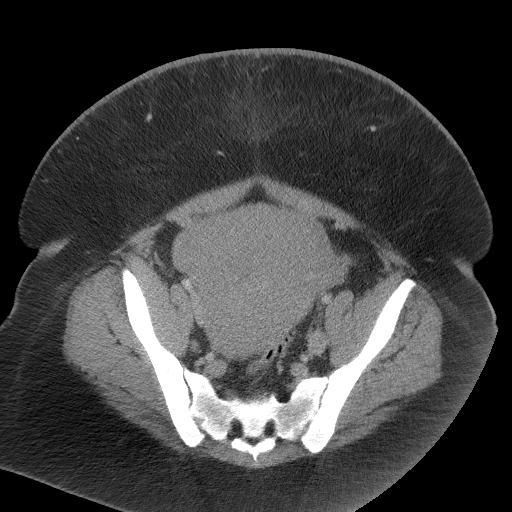
[im 38/99  soft-tissue]
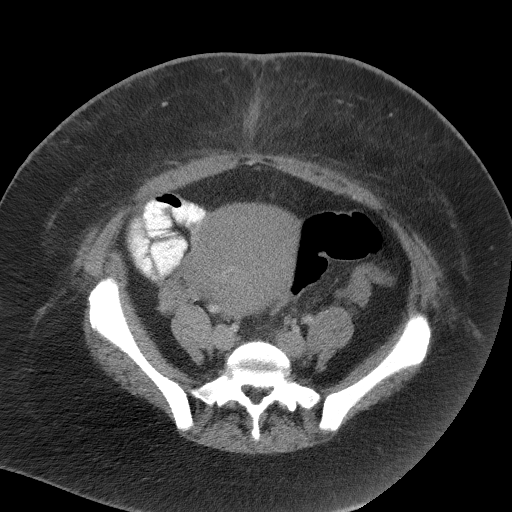
[im 46/99  soft-tissue]
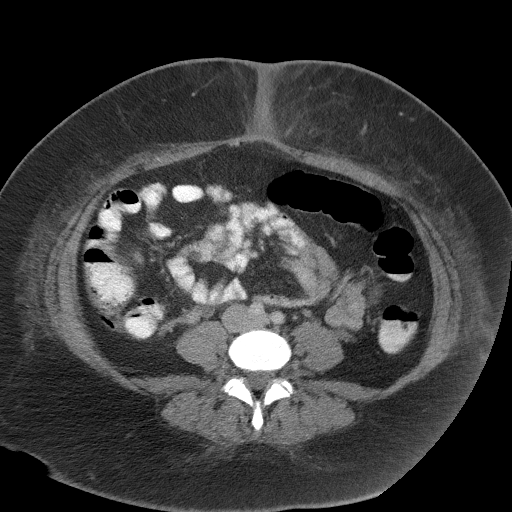
[im 53/99  soft-tissue]
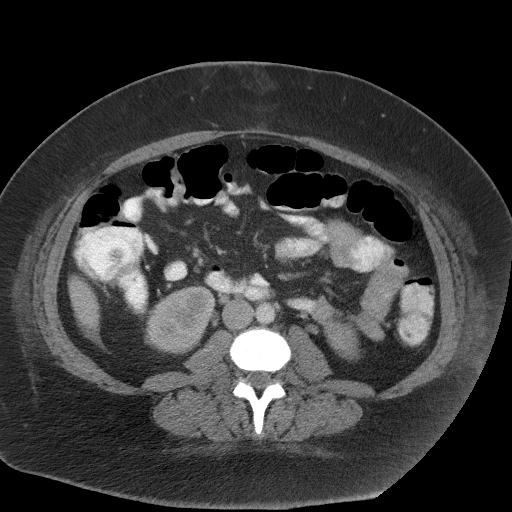
[im 61/99  soft-tissue]
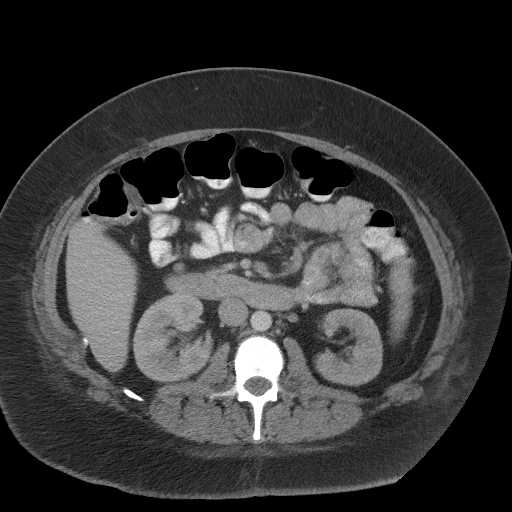
[im 68/99  soft-tissue]
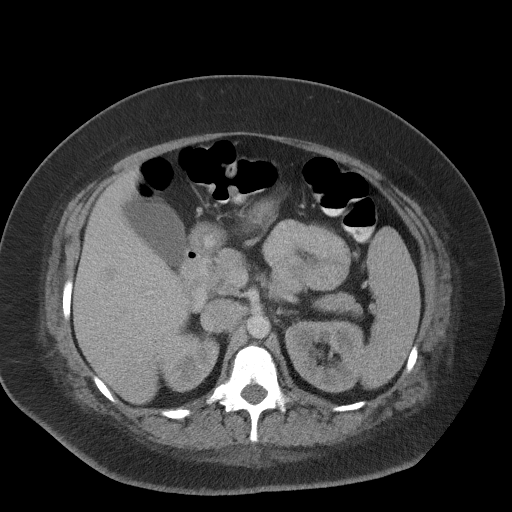
[im 68/99  bone]
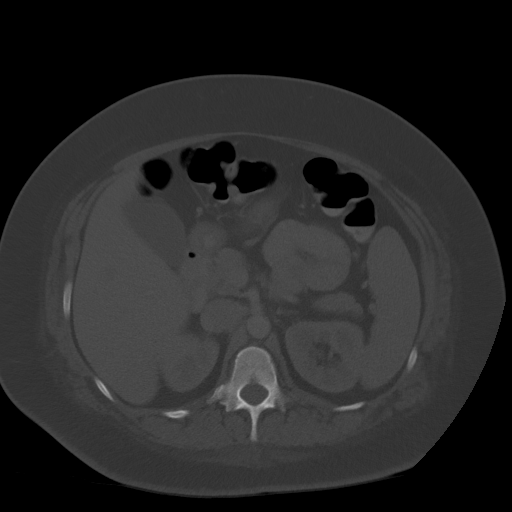
[im 76/99  soft-tissue]
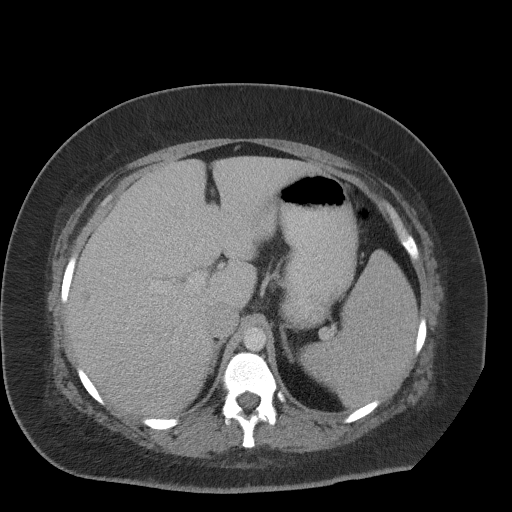
[im 83/99  soft-tissue]
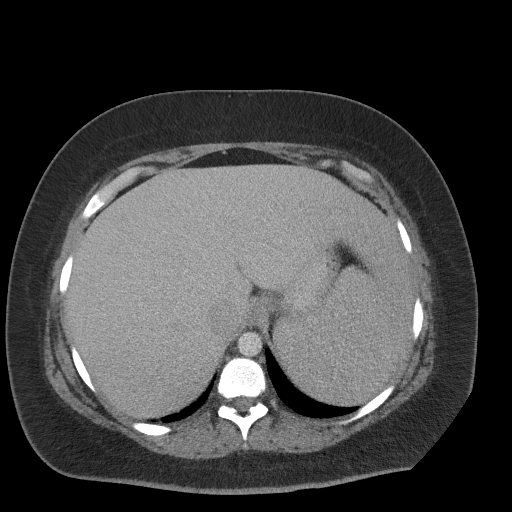
[im 91/99  soft-tissue]
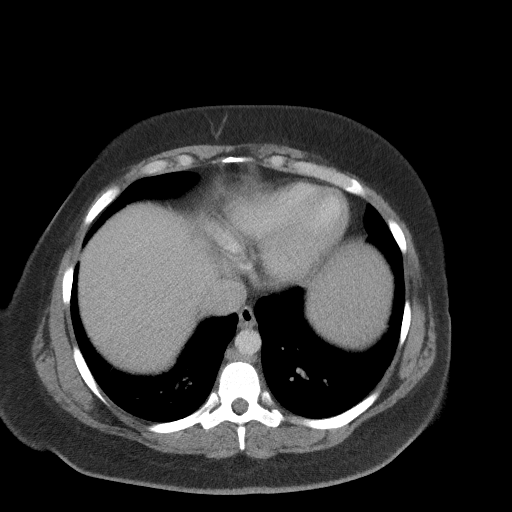

[Series 5: coronal st · coronal · 0.82mm/px · 3 of 119 slices shown]
[im 40/119  soft-tissue]
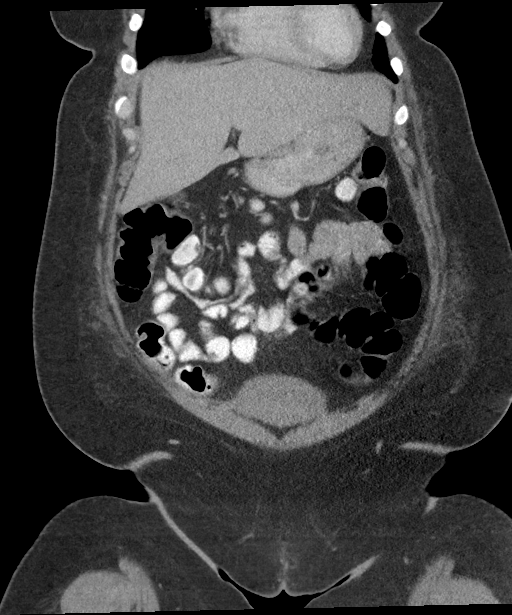
[im 53/119  soft-tissue]
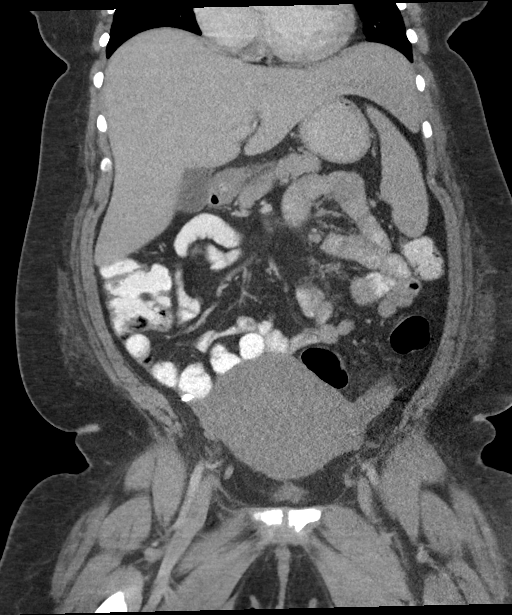
[im 66/119  soft-tissue]
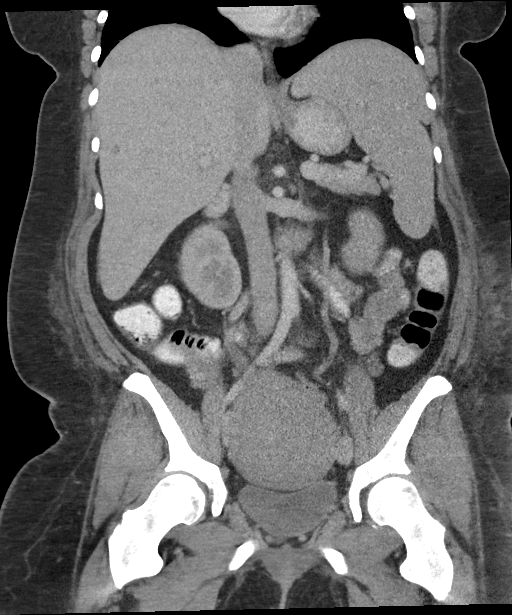

[15 of 46 positions shown; findings below may reference images not displayed]

FINDINGS: LOWER CHEST: There is no basilar pleural or apical pericardial
effusion.

HEPATOBILIARY: There are multiple hypoattenuating lesions scattered
within the liver, the largest of which is in the posterior right
hepatic lobe, measuring 22 mm. Trace perihepatic ascites. The
gallbladder is normal.

PANCREAS: The pancreatic parenchymal contours are normal and there
is no ductal dilatation. There is no peripancreatic fluid
collection.

SPLEEN: Normal.

ADRENALS/URINARY TRACT:

--Adrenal glands: Normal.

--Right kidney/ureter: No hydronephrosis, nephroureterolithiasis,
perinephric stranding or solid renal mass. Mild hydroureter.

--Left kidney/ureter: No hydronephrosis, nephroureterolithiasis,
perinephric stranding or solid renal mass.

--Urinary bladder: Normal for degree of distention

STOMACH/BOWEL:

--Stomach/Duodenum: There is no hiatal hernia or other gastric
abnormality. The duodenal course and caliber are normal.

--Small bowel: No dilatation or inflammation.

--Colon: No focal abnormality.

--Appendix: Normal.

VASCULAR/LYMPHATIC: Normal course and caliber of the major abdominal
vessels. No abdominal or pelvic lymphadenopathy.

REPRODUCTIVE: Recently gravid uterus. Bilateral tubal ligation
clips.

MUSCULOSKELETAL. No bony spinal canal stenosis or focal osseous
abnormality.

OTHER: Periumbilical incision with low attenuation material
anteriorly, likely bandaging. There is no subcutaneous fluid
collection. No intraperitoneal abnormality.
IMPRESSION: 1. Periumbilical incision site with no subcutaneous fluid
collection. Heterogeneous material anteriorly is probably bandaging.
2. Multiple hypoattenuating lesions within the liver that resolve on
the delayed phase images. These are favored to be hemangiomas, but
are strictly indeterminate. In the context of small amount of
perihepatic ascites, hepatic ultrasound is recommended for further
evaluation.

## 2021-07-02 ENCOUNTER — Other Ambulatory Visit: Payer: Self-pay

## 2021-07-02 ENCOUNTER — Ambulatory Visit: Payer: Medicaid Other | Admitting: Family Medicine

## 2021-07-02 VITALS — BP 126/85 | HR 90 | Temp 97.3°F | Ht 64.0 in | Wt 226.6 lb

## 2021-07-02 DIAGNOSIS — F411 Generalized anxiety disorder: Secondary | ICD-10-CM

## 2021-07-02 DIAGNOSIS — F321 Major depressive disorder, single episode, moderate: Secondary | ICD-10-CM | POA: Diagnosis not present

## 2021-07-02 DIAGNOSIS — F988 Other specified behavioral and emotional disorders with onset usually occurring in childhood and adolescence: Secondary | ICD-10-CM | POA: Diagnosis not present

## 2021-07-02 MED ORDER — SERTRALINE HCL 50 MG PO TABS: 50.0000 mg | ORAL_TABLET | Freq: Every day | ORAL | 3 refills | Status: DC

## 2021-07-02 MED ORDER — AMPHETAMINE-DEXTROAMPHET ER 10 MG PO CP24: 10.0000 mg | ORAL_CAPSULE | Freq: Every day | ORAL | 0 refills | Status: DC

## 2021-07-02 NOTE — Patient Instructions (Signed)
Major Depressive Disorder, Adult Major depressive disorder is a mental health condition. This disorder affects feelings. It can also affect the body. Symptoms of this condition last most of the day, almost every day, for 2 weeks. This disorder can affect: Relationships. Daily activities, such as work and school. Activities that you normally like to do. What are the causes? The cause of this condition is not known. The disorder is likely caused by a mix of things, including: Your personality, such as being a shy person. Your behavior, or how you act toward others. Your thoughts and feelings. Too much alcohol or drugs. How you react to stress. Health and mental problems that you have had for a long time. Things that hurt you in the past (trauma). Big changes in your life, such as divorce. What increases the risk? The following factors may make you more likely to develop this condition: Having family members with depression. Being a woman. Problems in the family. Low levels of some brain chemicals. Things that caused you pain as a child, especially if you lost a parent or were abused. A lot of stress in your life, such as from: Living without basic needs of life, such as food and shelter. Being treated poorly because of race, sex, or religion (discrimination). Health and mental problems that you have had for a long time. What are the signs or symptoms? The main symptoms of this condition are: Being sad all the time. Being grouchy all the time. Loss of interest in things and activities. Other symptoms include: Sleeping too much or too little. Eating too much or too little. Gaining or losing weight, without knowing why. Feeling tired or having low energy. Being restless and weak. Feeling hopeless, worthless, or guilty. Trouble thinking clearly or making decisions. Thoughts of hurting yourself or others, or thoughts of ending your life. Spending a lot of time alone. Inability to  complete common tasks of daily life. If you have very bad MDD, you may: Believe things that are not true. Hear, see, taste, or feel things that are not there. Have mild depression that lasts for at least 2 years. Feel very sad and hopeless. Have trouble speaking or moving. How is this treated? This condition may be treated with: Talk therapy. This teaches you to know bad thoughts, feelings, and actions and how to change them. This can also help you to communicate with others. This can be done with members of your family. Medicines. These can be used to treat worry (anxiety), depression, or low levels of chemicals in the brain. Lifestyle changes. You may need to: Limit alcohol use. Limit drug use. Get regular exercise. Get plenty of sleep. Make healthy eating choices. Spend more time outdoors. Brain stimulation. This treatment excites the brain. This is done when symptoms are very bad or have not gotten better with other treatments. Follow these instructions at home: Activity Get regular exercise as told. Spend time outdoors as told. Make time to do the things you enjoy. Find ways to deal with stress. Try to: Meditate. Do deep breathing. Spend time in nature. Keep a journal. Return to your normal activities as told by your doctor. Ask your doctor what activities are safe for you. Alcohol and drug use If you drink alcohol: Limit how much you use to: 0-1 drink a day for women. 0-2 drinks a day for men. Be aware of how much alcohol is in your drink. In the [place] one drink equals one 12 oz bottle of beer (355 mL),   one 5 oz glass of wine (148 mL), or one 1 oz glass of hard liquor (44 mL). Talk to your doctor about: Alcohol use. Alcohol can affect some medicines. Any drug use. General instructions  Take over-the-counter and prescription medicines and herbal preparations only as told by your doctor. Eat a healthy diet. Get a lot of sleep. Think about joining a support group.  Your doctor may be able to suggest one. Keep all follow-up visits as told by your doctor. This is important.  Where to find more information: National Alliance on Mental Illness: www.nami.org [place] National Institute of Mental Health: www.nimh.nih.gov American Psychiatric Association: www.psychiatry.org/patients-families/ Contact a doctor if: Your symptoms get worse. You get new symptoms. Get help right away if: You hurt yourself. You have serious thoughts about hurting yourself or others. You see, hear, taste, smell, or feel things that are not there. If you ever feel like you may hurt yourself or others, or have thoughts about taking your own life, get help right away. Go to your nearest emergency department or: Call your local emergency services (911 in the [place] Call a suicide crisis helpline, such as the National Suicide Prevention Lifeline at 1-800-273-8255. This is open 24 hours a day in the [place] Text the Crisis Text Line at 741741 (in the [place] Summary Major depressive disorder is a mental health condition. This disorder affects feelings. Symptoms of this condition last most of the day, almost every day, for 2 weeks. The symptoms of this disorder can cause problems with relationships and with daily activities. There are treatments and support for people who get this disorder. You may need more than one type of treatment. Get help right away if you have serious thoughts about hurting yourself or others. This information is not intended to replace advice given to you by your health care provider. Make sure you discuss any questions you have with your healthcare provider. Document Revised: 09/04/2019 Document Reviewed: 09/04/2019 Elsevier Patient Education  2022 Elsevier Inc.  

## 2021-07-02 NOTE — Progress Notes (Signed)
   Subjective:    Patient ID: Kathleen Patrick, female    DOB: 07-28-1991, 30 y.o.   MRN: [id number]  HPI Pt here to discuss getting back on Adderall XR and Zoloft. Pt had taken Lexapro before but states Lexapro made her feel "zombied out". Pt states she is "having a hard time with life at this time" Please see PHQ-9 please also see GAD-7  Patient also denies being in suicidal.  Finds her self feeling stressed depressed anxious nervous occasional panic attacks finds her self feeling down and blue sad present over the past several months does not feel counseling would be helpful but is interested in starting medication  Patient had ADD in the high school was on medication in the high school and through early college years then she went off the medicine but she feels that the medicine would benefit her staying more on task and more focused we did discuss how the medication is a controlled medicine it is important to be monitored while on medicine.  We also discussed how the medicine would reduce the risk of severe ADD but may not totally eliminate it.  This patient has adult ADD. Takes medication responsibly. Medication does help the patient focus in be more functional. Patient relates that they are or not abusing the medication or misusing the medication. The patient understands that if they're having any negative side effects such as elevated high blood pressure severe headaches they would need stop the medication follow-up immediately. They also understand that the prescriptions are to last for 3 months then the patient will need to follow-up before having further prescriptions.  Patient compliance just starting  Does medication help patient function /attention better she is starting medicine  Side effects side effects were discussed in detail    Review of Systems     Objective:   Physical Exam  General-in no acute distress Eyes-no discharge Lungs-respiratory rate normal, CTA CV-no  murmurs,RRR Extremities skin warm dry no edema Neuro grossly normal Behavior normal, alert       Assessment & Plan:   1. Depression, major, single episode, moderate (HCC) Major depression not suicidal start Zoloft 50 mg 1/2 tablet for the first week then 1 tablet thereafter recommend follow-up visit in 3 to 4 weeks virtually to discuss how this medicine is doing warning signs regarding suicidal ideation as well as when to seek emergency care and how to do so was discussed in detail  2. GAD (generalized anxiety disorder) Sertraline as above May increase dose depending on response  3. Attention deficit disorder, unspecified hyperactivity presence Had this during high school still very prominent having difficult time focusing and staying on task and remembering important items we did discuss limitations of the medicine, controlled aspects of the medicine, need for yearly urine drug screen, will start with low-dose do a virtual follow-up 3 to 4 weeks and do in person follow-up in 3 months

## 2021-07-23 ENCOUNTER — Ambulatory Visit (INDEPENDENT_AMBULATORY_CARE_PROVIDER_SITE_OTHER): Payer: Medicaid Other | Admitting: Family Medicine

## 2021-07-23 DIAGNOSIS — F321 Major depressive disorder, single episode, moderate: Secondary | ICD-10-CM | POA: Diagnosis not present

## 2021-07-23 DIAGNOSIS — F988 Other specified behavioral and emotional disorders with onset usually occurring in childhood and adolescence: Secondary | ICD-10-CM | POA: Diagnosis not present

## 2021-07-23 MED ORDER — AMPHETAMINE-DEXTROAMPHET ER 20 MG PO CP24: 20.0000 mg | ORAL_CAPSULE | Freq: Every day | ORAL | 0 refills | Status: DC

## 2021-07-23 NOTE — Progress Notes (Signed)
   Subjective:  I connected with  Kathleen Patrick on 07/23/21 by a phone enabled telemedicine application and verified that I am speaking with the correct person using two identifiers.   I discussed the limitations of evaluation and management by telemedicine. The patient expressed understanding and agreed to proceed.  Patient location: home  Provider location: in office  I provided 12 minutes of non face - to - face time during this encounter.   Patient ID: Kathleen Patrick, female    DOB: 1991-01-09, 30 y.o.   MRN: [id number] Video not possible, telephone only HPI Medication follow up for depression, no concerns reported States that Zoloft is helping some but has not seen a big change at this point.  She understands it can take some time for it to kick in She is interested in trying to do the best she can with healthy eating regular physical activity and staying with the medicine at the current dose  As for the adult ADD the medication is not helping much at all at this point she understands the limitation of medication for adult ADD She did have ADD when she was a child we will bump up the dose to 20 mg  Review of Systems     Objective:   Physical Exam  Today's visit was via telephone Physical exam was not possible for this visit       Assessment & Plan:  1. Depression, major, single episode, moderate (HCC) Continue medication as is follow-up again in 8 weeks time at that point time if not doing better bump up dose of medicine patient is send [place] a MyChart message in 3 to 4 weeks  2. Attention deficit disorder, unspecified hyperactivity presence Increase the dosage to 20 mg if not doing dramatically better over the course of the next month may need to go to 30 mg but that would be the top dose  Otherwise patient to give [place] feedback and to follow-up in person in approximately 2 to 3 months

## 2021-09-03 ENCOUNTER — Encounter: Payer: Self-pay | Admitting: Family Medicine

## 2021-09-03 ENCOUNTER — Other Ambulatory Visit: Payer: Self-pay | Admitting: Family Medicine

## 2021-09-03 MED ORDER — AMPHETAMINE-DEXTROAMPHET ER 20 MG PO CP24: 20.0000 mg | ORAL_CAPSULE | Freq: Every day | ORAL | 0 refills | Status: DC

## 2021-09-03 MED ORDER — SERTRALINE HCL 100 MG PO TABS: ORAL_TABLET | ORAL | 4 refills | Status: DC

## 2021-09-03 NOTE — Telephone Encounter (Signed)
Nurses-please send in the medication a.m. forward message to the patient   it would be fine to increase the dose to 100 mg, 1 daily, #30 with 4 refills, patient to send [place] an update within 30 days how that is going  It would also be wise to do a follow-up within 3 months sooner if things are not going well  Feel free to reach out with any questions or concerns Thanks-Dr. Lorin Picket

## 2021-10-01 ENCOUNTER — Other Ambulatory Visit: Payer: Self-pay | Admitting: Family Medicine

## 2021-10-02 MED ORDER — AMPHETAMINE-DEXTROAMPHET ER 20 MG PO CP24: 20.0000 mg | ORAL_CAPSULE | Freq: Every day | ORAL | 0 refills | Status: DC

## 2021-10-10 ENCOUNTER — Telehealth: Payer: Self-pay | Admitting: Family Medicine

## 2021-10-10 NOTE — Telephone Encounter (Signed)
The pharmacist stated they have Concerta 18 and 27 currently but not sure if covered by [facility]

## 2021-10-10 NOTE — Telephone Encounter (Signed)
Rather then guessing Do they have Vyvanse, Concerta, Metadate ER ? Let me know I will finish this tomorrow

## 2021-10-10 NOTE — Telephone Encounter (Signed)
Pt called and stated her pharmacy, [facility] in [place], called her and stated her Adderall was on back order in all mg. She would like a substitute. [phone]

## 2021-10-11 ENCOUNTER — Other Ambulatory Visit: Payer: Self-pay | Admitting: Family Medicine

## 2021-10-11 MED ORDER — CONCERTA 27 MG PO TBCR: 27.0000 mg | EXTENDED_RELEASE_TABLET | ORAL | 0 refills | Status: DC

## 2021-10-11 NOTE — Telephone Encounter (Signed)
Please see if concerta is cobvered by medicaid or if not then impower the patient to call around if she finds a different pharmacy that carries Adderall XR I can send it there Also if you find other ADD meds covered by [employer] and there availability I can send other in Bottom line : I will not send in other rx without knowing if it is covered and avaialble If her pharmacy does not have any ADD meds covered by Medicaid then she needs to call around ( I can not take responsibility for this)

## 2021-10-11 NOTE — Telephone Encounter (Signed)
Concerta 27 sent in for 30 days Withh ADD meds requires OV q 3to 4 months

## 2021-10-11 NOTE — Telephone Encounter (Signed)
Patient informed prescription sent and will require OV every 3-4 months. Verbalized understanding.

## 2021-11-08 ENCOUNTER — Ambulatory Visit (HOSPITAL_COMMUNITY)
Admission: RE | Admit: 2021-11-08 | Discharge: 2021-11-08 | Disposition: A | Discharge status: Home / Self Care | Payer: Medicaid Other | Source: Ambulatory Visit | Attending: Family Medicine | Admitting: Family Medicine

## 2021-11-08 ENCOUNTER — Other Ambulatory Visit: Payer: Self-pay | Admitting: Family Medicine

## 2021-11-08 ENCOUNTER — Other Ambulatory Visit: Payer: Self-pay

## 2021-11-08 ENCOUNTER — Encounter: Payer: Self-pay | Admitting: Family Medicine

## 2021-11-08 ENCOUNTER — Ambulatory Visit (INDEPENDENT_AMBULATORY_CARE_PROVIDER_SITE_OTHER): Payer: Medicaid Other | Admitting: Family Medicine

## 2021-11-08 VITALS — BP 114/64 | Temp 97.4°F | Wt 221.0 lb

## 2021-11-08 DIAGNOSIS — S60152A Contusion of left little finger with damage to nail, initial encounter: Secondary | ICD-10-CM

## 2021-11-08 NOTE — Progress Notes (Signed)
° °  Subjective:    Patient ID: Kathleen Patrick, female    DOB: 1991/05/12, 31 y.o.   MRN: [id number]  HPI Pt fell down steps this morning and has possible broken left ring finger. Left ring finger is swollen.   Injured her finger Swollen tender painful Difficult to make a fist Does not appear out of alignment on basic exam Review of Systems     Objective:   Physical Exam  Tenderness and swelling in the small finger and the proximal portion difficulty making a fist rest of fingers hand and wrist are normal  Alignment with making a fist is good    Assessment & Plan:  Probable fracture x-ray ordered await the results Splint fashioned Cold compresses 20 minutes at a time every 2 hours while awake May need orthopedic referral  X-ray came back negative.  Message sent to patient to watch this area use the splint over the next 2 to 3 days, cold compresses frequently, start doing flexing exercises in 3 to 4 days, give [place] feedback within 10 to 12 days how things are going if not completely well at that time recheck for possibility of repeat x-ray

## 2021-11-10 MED ORDER — CONCERTA 27 MG PO TBCR: 27.0000 mg | EXTENDED_RELEASE_TABLET | ORAL | 0 refills | Status: DC

## 2021-11-21 ENCOUNTER — Ambulatory Visit: Payer: Medicaid Other | Admitting: Family Medicine

## 2021-12-13 ENCOUNTER — Other Ambulatory Visit: Payer: Self-pay | Admitting: Family Medicine

## 2021-12-13 ENCOUNTER — Encounter: Payer: Self-pay | Admitting: *Deleted

## 2021-12-13 NOTE — Telephone Encounter (Signed)
Message sent thru MyChart that will need an appointment before receiving refill on Concerta. ?

## 2021-12-13 NOTE — Telephone Encounter (Signed)
Patient needs an office visit with myself or [place] before having any refills ?

## 2021-12-16 ENCOUNTER — Other Ambulatory Visit: Payer: Self-pay | Admitting: Family Medicine

## 2021-12-16 MED ORDER — CONCERTA 27 MG PO TBCR: 27.0000 mg | EXTENDED_RELEASE_TABLET | ORAL | 0 refills | Status: DC

## 2022-01-03 ENCOUNTER — Ambulatory Visit: Payer: Medicaid Other | Admitting: Family Medicine

## 2022-01-03 ENCOUNTER — Encounter: Payer: Self-pay | Admitting: Family Medicine

## 2022-04-25 ENCOUNTER — Other Ambulatory Visit: Payer: Self-pay | Admitting: Family Medicine

## 2022-07-15 ENCOUNTER — Encounter: Payer: Self-pay | Admitting: Family Medicine

## 2022-07-15 ENCOUNTER — Ambulatory Visit: Payer: Medicaid Other | Admitting: Family Medicine

## 2022-07-15 VITALS — BP 112/66 | Wt 223.4 lb

## 2022-07-15 DIAGNOSIS — F324 Major depressive disorder, single episode, in partial remission: Secondary | ICD-10-CM | POA: Diagnosis not present

## 2022-07-15 DIAGNOSIS — F988 Other specified behavioral and emotional disorders with onset usually occurring in childhood and adolescence: Secondary | ICD-10-CM

## 2022-07-15 DIAGNOSIS — F411 Generalized anxiety disorder: Secondary | ICD-10-CM | POA: Diagnosis not present

## 2022-07-15 MED ORDER — AMPHETAMINE-DEXTROAMPHET ER 20 MG PO CP24: 20.0000 mg | ORAL_CAPSULE | Freq: Every day | ORAL | 0 refills | Status: DC

## 2022-07-15 MED ORDER — SERTRALINE HCL 100 MG PO TABS: ORAL_TABLET | ORAL | 5 refills | Status: DC

## 2022-07-15 NOTE — Progress Notes (Signed)
   Subjective:    Patient ID: Kathleen Patrick, female    DOB: 20-Jul-1991, 31 y.o.   MRN: [id number]  HPI Pt arrives to follow up on medication. Pt states that she has not been on any medication due to not having any refills. Pt states she would like to go back to Adderall; pt does not like Concerta-keeps her up. Pt has not had Zoloft in about one month.   Patient has history of depression and anxiety.  Recently has had some depression symptoms but more so anxiety symptoms feels very stressed has multiple small children she is the only primary breadwinner in her household a lot of stress on her she denies being suicidal  She has a history of ADD takes the medication but stopped taking it earlier this year since then she has a hard time focusing and staying on track at her job she states she will often make simple mistakes because of inability to focus and concentrate she did better on medicine in the past Review of Systems     Objective:   Physical Exam  General-in no acute distress Eyes-no discharge Lungs-respiratory rate normal, CTA CV-no murmurs,RRR Extremities skin warm dry no edema Neuro grossly normal Behavior normal, alert       Assessment & Plan:  1. GAD (generalized anxiety disorder) Resume Zoloft half tablet daily for the first 1 to 2 weeks then 1 tablet daily send [place] an update via MyChart within 3 weeks how she is doing  2. Attention deficit disorder, unspecified hyperactivity presence Adderall XR 20 mg daily send [place] update within 3 weeks if doing well we will send an additional prescription follow-up in 3 to 4 months patient currently utilizing these on a workday  3. Depression, major, single episode, in partial remission ([place])  Patient denies being suicidal if she gets worse needs a follow-up counseling if ongoing troubles as well patient is aware she will send [place] an update within 3 weeks time

## 2022-07-15 NOTE — Patient Instructions (Signed)
Send me update on medicine in 3 weeks

## 2022-07-30 ENCOUNTER — Ambulatory Visit: Payer: Medicaid Other | Admitting: Family Medicine

## 2022-10-15 ENCOUNTER — Ambulatory Visit: Payer: Medicaid Other | Admitting: Family Medicine

## 2023-03-06 ENCOUNTER — Ambulatory Visit: Payer: Medicaid Other | Admitting: Family Medicine

## 2023-03-11 ENCOUNTER — Ambulatory Visit: Payer: Medicaid Other | Admitting: Family Medicine

## 2023-03-11 VITALS — BP 130/80 | HR 103 | Ht 64.0 in | Wt 223.2 lb

## 2023-03-11 DIAGNOSIS — F411 Generalized anxiety disorder: Secondary | ICD-10-CM

## 2023-03-11 DIAGNOSIS — F988 Other specified behavioral and emotional disorders with onset usually occurring in childhood and adolescence: Secondary | ICD-10-CM

## 2023-03-11 DIAGNOSIS — F324 Major depressive disorder, single episode, in partial remission: Secondary | ICD-10-CM | POA: Diagnosis not present

## 2023-03-11 MED ORDER — AMPHETAMINE-DEXTROAMPHET ER 20 MG PO CP24: 20.0000 mg | ORAL_CAPSULE | ORAL | 0 refills | Status: AC

## 2023-03-11 MED ORDER — FLUOXETINE HCL 20 MG PO CAPS: ORAL_CAPSULE | ORAL | 3 refills | Status: AC

## 2023-03-11 MED ORDER — AMPHETAMINE-DEXTROAMPHET ER 20 MG PO CP24: 20.0000 mg | ORAL_CAPSULE | Freq: Every day | ORAL | 0 refills | Status: AC

## 2023-03-11 NOTE — Progress Notes (Signed)
This patient has adult ADD. Takes medication responsibly. Medication does help the patient focus in be more functional. Patient relates that they are or not abusing the medication or misusing the medication. The patient understands that if they're having any negative side effects such as elevated high blood pressure severe headaches they would need stop the medication follow-up immediately. They also understand that the prescriptions are to last for 3 months then the patient will need to follow-up before having further prescriptions.  Patient compliance yes  Does medication help patient function /attention better  yes  Side effects  none  Pt also with increased stress, anxiety , and depression  General-in no acute distress Eyes-no discharge Lungs-respiratory rate normal, CTA CV-no murmurs,RRR Extremities skin warm dry no edema Neuro grossly normal Behavior normal, alert    1. GAD (generalized anxiety disorder) Stop zoloft Start prozac Patient finds herself anxious on edge a lot she states she is stressed between home life work and raising young children. 2. Attention deficit disorder, unspecified hyperactivity presence 3 rx ADD meds PDMP was checked Patient compliant with medicine until she got lost to follow-up she is interested in getting back on medicine because it does help her stay more focused and attentive 3. Depression, major, single episode, in partial remission (HCC) Prozac Pt defers counseling She denies being suicidal.  Just finds herself feeling stressed feeling down and blue feels like the Zoloft not helping any longer she does not want to do counseling because she is too busy but she is interested in trying Prozac she will give [place] feedback within the next few weeks how she is doing and she will follow-up in 3 months FU OV 3 months

## 2023-06-05 ENCOUNTER — Ambulatory Visit: Payer: Medicaid Other | Admitting: Family Medicine

## 2023-08-30 ENCOUNTER — Other Ambulatory Visit: Payer: Self-pay | Admitting: Family Medicine
